# Patient Record
Sex: Female | Born: 1945 | Race: White | Hispanic: No | Marital: Married | Smoking: Never smoker
Health system: Southern US, Community
[De-identification: ages and names within clinical notes are randomized; demographics above are authoritative.]

## PROBLEM LIST (undated history)

## (undated) DIAGNOSIS — N390 Urinary tract infection, site not specified: Secondary | ICD-10-CM

## (undated) DIAGNOSIS — IMO0002 Reserved for concepts with insufficient information to code with codable children: Secondary | ICD-10-CM

## (undated) DIAGNOSIS — K219 Gastro-esophageal reflux disease without esophagitis: Secondary | ICD-10-CM

## (undated) HISTORY — PX: ANKLE SURGERY: SHX546

## (undated) HISTORY — PX: OTHER SURGICAL HISTORY: SHX169

## (undated) HISTORY — PX: TOE SURGERY: SHX1073

## (undated) HISTORY — PX: SHOULDER ARTHROSCOPY WITH ROTATOR CUFF REPAIR: SHX5685

## (undated) HISTORY — DX: Reserved for concepts with insufficient information to code with codable children: IMO0002

## (undated) HISTORY — PX: APPENDECTOMY: SHX54

## (undated) HISTORY — PX: ABDOMINAL HYSTERECTOMY: SHX81

---

## 2014-09-25 ENCOUNTER — Encounter: Payer: Self-pay | Admitting: Family Medicine

## 2014-09-25 ENCOUNTER — Ambulatory Visit (INDEPENDENT_AMBULATORY_CARE_PROVIDER_SITE_OTHER): Payer: 59 | Admitting: Family Medicine

## 2014-09-25 VITALS — BP 122/60 | HR 76 | Ht 63.5 in | Wt 142.2 lb

## 2014-09-25 DIAGNOSIS — H3530 Unspecified macular degeneration: Secondary | ICD-10-CM | POA: Insufficient documentation

## 2014-09-25 DIAGNOSIS — R739 Hyperglycemia, unspecified: Secondary | ICD-10-CM | POA: Diagnosis not present

## 2014-09-25 DIAGNOSIS — Z800 Family history of malignant neoplasm of digestive organs: Secondary | ICD-10-CM | POA: Diagnosis not present

## 2014-09-25 DIAGNOSIS — Z818 Family history of other mental and behavioral disorders: Secondary | ICD-10-CM

## 2014-09-25 DIAGNOSIS — R7303 Prediabetes: Secondary | ICD-10-CM

## 2014-09-25 DIAGNOSIS — H35369 Drusen (degenerative) of macula, unspecified eye: Secondary | ICD-10-CM

## 2014-09-25 DIAGNOSIS — R413 Other amnesia: Secondary | ICD-10-CM

## 2014-09-25 DIAGNOSIS — R7309 Other abnormal glucose: Secondary | ICD-10-CM

## 2014-09-25 DIAGNOSIS — Z85828 Personal history of other malignant neoplasm of skin: Secondary | ICD-10-CM

## 2014-09-25 DIAGNOSIS — G4700 Insomnia, unspecified: Secondary | ICD-10-CM | POA: Diagnosis not present

## 2014-09-25 DIAGNOSIS — E559 Vitamin D deficiency, unspecified: Secondary | ICD-10-CM | POA: Diagnosis not present

## 2014-09-25 DIAGNOSIS — Z825 Family history of asthma and other chronic lower respiratory diseases: Secondary | ICD-10-CM | POA: Diagnosis not present

## 2014-09-25 DIAGNOSIS — M79671 Pain in right foot: Secondary | ICD-10-CM | POA: Diagnosis not present

## 2014-09-25 DIAGNOSIS — Z23 Encounter for immunization: Secondary | ICD-10-CM | POA: Diagnosis not present

## 2014-09-25 MED ORDER — TRAZODONE HCL 50 MG PO TABS: 25.0000 mg | ORAL_TABLET | Freq: Every evening | ORAL | Status: DC | PRN

## 2014-09-26 DIAGNOSIS — Z23 Encounter for immunization: Secondary | ICD-10-CM | POA: Diagnosis not present

## 2014-09-26 DIAGNOSIS — E559 Vitamin D deficiency, unspecified: Secondary | ICD-10-CM | POA: Insufficient documentation

## 2014-09-26 DIAGNOSIS — R7303 Prediabetes: Secondary | ICD-10-CM | POA: Insufficient documentation

## 2014-09-26 LAB — COMPREHENSIVE METABOLIC PANEL
A/G RATIO: 1.8 (ref 1.1–2.5)
ALK PHOS: 84 IU/L (ref 39–117)
ALT: 30 IU/L (ref 0–32)
AST: 35 IU/L (ref 0–40)
Albumin: 5 g/dL — ABNORMAL HIGH (ref 3.5–4.8)
BILIRUBIN TOTAL: 0.4 mg/dL (ref 0.0–1.2)
BUN/Creatinine Ratio: 15 (ref 11–26)
BUN: 12 mg/dL (ref 8–27)
CHLORIDE: 99 mmol/L (ref 97–108)
CO2: 21 mmol/L (ref 18–29)
Calcium: 10.3 mg/dL (ref 8.7–10.3)
Creatinine, Ser: 0.79 mg/dL (ref 0.57–1.00)
GFR calc Af Amer: 87 mL/min/{1.73_m2} (ref >59)
GFR, EST NON AFRICAN AMERICAN: 76 mL/min/{1.73_m2} (ref >59)
GLOBULIN, TOTAL: 2.8 g/dL (ref 1.5–4.5)
Glucose: 95 mg/dL (ref 65–99)
POTASSIUM: 4.3 mmol/L (ref 3.5–5.2)
SODIUM: 141 mmol/L (ref 134–144)
Total Protein: 7.8 g/dL (ref 6.0–8.5)

## 2014-09-26 LAB — HEMOGLOBIN A1C
ESTIMATED AVERAGE GLUCOSE: 137 mg/dL
HEMOGLOBIN A1C: 6.4 % — AB (ref 4.8–5.6)

## 2014-09-26 LAB — CBC
HEMOGLOBIN: 14.7 g/dL (ref 11.1–15.9)
Hematocrit: 44.1 % (ref 34.0–46.6)
MCH: 30.8 pg (ref 26.6–33.0)
MCHC: 33.3 g/dL (ref 31.5–35.7)
MCV: 93 fL (ref 79–97)
PLATELETS: 267 10*3/uL (ref 150–379)
RBC: 4.77 x10E6/uL (ref 3.77–5.28)
RDW: 14.5 % (ref 12.3–15.4)
WBC: 7.1 10*3/uL (ref 3.4–10.8)

## 2014-09-26 LAB — VITAMIN D 25 HYDROXY (VIT D DEFICIENCY, FRACTURES): VIT D 25 HYDROXY: 26.4 ng/mL — AB (ref 30.0–100.0)

## 2014-09-26 LAB — VITAMIN B12: VITAMIN B 12: 684 pg/mL (ref 211–946)

## 2014-09-26 MED ORDER — VITAMIN D 50 MCG (2000 UT) PO CAPS: 1.0000 | ORAL_CAPSULE | Freq: Every day | ORAL | Status: DC

## 2014-09-27 ENCOUNTER — Encounter: Payer: Self-pay | Admitting: Family Medicine

## 2014-09-27 DIAGNOSIS — Z818 Family history of other mental and behavioral disorders: Secondary | ICD-10-CM | POA: Insufficient documentation

## 2014-09-27 DIAGNOSIS — Z800 Family history of malignant neoplasm of digestive organs: Secondary | ICD-10-CM | POA: Insufficient documentation

## 2014-09-27 DIAGNOSIS — Z825 Family history of asthma and other chronic lower respiratory diseases: Secondary | ICD-10-CM | POA: Insufficient documentation

## 2014-09-27 NOTE — Progress Notes (Signed)
Date:  09/25/2014   Name:  Melissa Trujillo   DOB:  1945-08-01   MRN:  [id number]  PCP:  [name], MD    Chief Complaint: Establish Care   History of Present Illness:  This is a 69 y.o. female with MMP to establish care. Has taken Xanax every 2-3 days to help sleep for 11 years, has not tried to discontinue. Hs Mohs surgery for BCC, needs derm f/u. Has preMD drusen, scheduled to see optho this month. 10# heavier than usual, not exercising as much due to R foot pain, has 1st MT implant, needs to see podiatry. Hyperglycemia on past labs. Some increased word finding and ST memory issues lately. Father died colon ca, mother with COPD, brother bipolar. Needs Tdap/zoster/Prevnar/flu (hx zoster 2 yrs ago).  Review of Systems:  Review of Systems  Constitutional: Negative for fever, chills and unexpected weight change.  HENT: Negative for ear pain, sore throat and trouble swallowing.   Eyes: Negative for pain.  Respiratory: Negative for shortness of breath.   Cardiovascular: Negative for chest pain and leg swelling.  Gastrointestinal: Negative for abdominal pain.  Endocrine: Negative for polyuria.  Genitourinary: Negative for difficulty urinating.  Skin: Negative for rash.  Neurological: Negative for dizziness, tremors and headaches.  Hematological: Negative for adenopathy.  Psychiatric/Behavioral: Negative for dysphoric mood.    Patient Active Problem List   Diagnosis Date Noted  . Prediabetes 09/26/2014  . Vitamin D insufficiency 09/26/2014  . Short-term memory loss 09/25/2014  . Insomnia 09/25/2014  . Hyperglycemia 09/25/2014  . Right foot pain 09/25/2014  . Hx of basal cell carcinoma 09/25/2014  . Macular degeneration 09/25/2014  . Drusen 09/25/2014    Prior to Admission medications   Medication Sig Start Date End Date Taking? Authorizing Provider  Multiple Vitamin (MULTIVITAMIN) tablet Take 1 tablet by mouth daily.   Yes Historical Provider, MD  Cholecalciferol (VITAMIN D)  2000 UNITS CAPS Take 1 capsule (2,000 Units total) by mouth daily. 09/26/14   [name], MD  traZODone (DESYREL) 50 MG tablet Take 0.5-1 tablets (25-50 mg total) by mouth at bedtime as needed for sleep. 09/25/14   [name], MD    No Known Allergies  Past Surgical History  Procedure Laterality Date  . Ankle surgery    . Shoulder arthroscopy with rotator cuff repair Bilateral   . Toe surgery Right     right prosthesis  . Abdominal hysterectomy    . Appendectomy    . Moes surgery      Left temporal area    Social History  Substance Use Topics  . Smoking status: Never Smoker   . Smokeless tobacco: Not on file  . Alcohol Use: 0.0 oz/week    0 Standard drinks or equivalent per week    No family history on file.  Medication list has been reviewed and updated.  Physical Examination: BP 122/60 mmHg  Pulse 76  Ht 5' 3.5" (1.613 m)  Wt 142 lb 3.2 oz (64.501 kg)  BMI 24.79 kg/m2  Physical Exam  Constitutional: She is oriented to person, place, and time. She appears well-developed and well-nourished.  HENT:  Head: Normocephalic and atraumatic.  Right Ear: External ear normal.  Left Ear: External ear normal.  Nose: Nose normal.  Mouth/Throat: Oropharynx is clear and moist.  Eyes: Conjunctivae and EOM are normal. Pupils are equal, round, and reactive to light.  Neck: Neck supple. No thyromegaly present.  Cardiovascular: Normal rate, regular rhythm and normal heart sounds.   Pulmonary/Chest: Effort  normal and breath sounds normal.  Abdominal: Soft. She exhibits no distension and no mass. There is no tenderness.  Musculoskeletal: She exhibits no edema.  Lymphadenopathy:    She has no cervical adenopathy.  Neurological: She is alert and oriented to person, place, and time. Coordination normal.  MMSE 25/30 (unable to perform any serial 7's, says always bad at math)  Skin: Skin is warm and dry.  Psychiatric: She has a normal mood and affect. Her behavior is normal.   Nursing note and vitals reviewed.   Assessment and Plan:  1. Right foot pain S/p 1st MT implant - Ambulatory referral to Podiatry  2. Short-term memory loss No objective cognitive deficit, check labs - Comprehensive metabolic panel - CBC - Vitamin D (25 hydroxy) - B12  3. Insomnia Advised d/c Xanax as may be contributing to ST memory issues, trial prn trazodone instead  4. Hyperglycemia - HgB A1c  5. Hx of basal cell carcinoma S/p Mohs surgery - Ambulatory referral to Dermatology  6. Drusen Has optho appt scheduled this month  7. Delayed imms Tdap and flu today, plan Prevnar/Zostavax next visit  Return in about 4 weeks (around 10/23/2014).  Satira Anis. [facility]  09/27/2014

## 2014-10-23 ENCOUNTER — Encounter: Payer: Self-pay | Admitting: Family Medicine

## 2014-10-23 ENCOUNTER — Ambulatory Visit (INDEPENDENT_AMBULATORY_CARE_PROVIDER_SITE_OTHER): Payer: 59 | Admitting: Family Medicine

## 2014-10-23 VITALS — BP 122/64 | HR 72 | Ht 63.5 in | Wt 151.0 lb

## 2014-10-23 DIAGNOSIS — G4700 Insomnia, unspecified: Secondary | ICD-10-CM | POA: Diagnosis not present

## 2014-10-23 DIAGNOSIS — R7303 Prediabetes: Secondary | ICD-10-CM

## 2014-10-23 DIAGNOSIS — Z85828 Personal history of other malignant neoplasm of skin: Secondary | ICD-10-CM | POA: Diagnosis not present

## 2014-10-23 DIAGNOSIS — E559 Vitamin D deficiency, unspecified: Secondary | ICD-10-CM | POA: Diagnosis not present

## 2014-10-23 DIAGNOSIS — Z283 Underimmunization status: Secondary | ICD-10-CM | POA: Diagnosis not present

## 2014-10-23 DIAGNOSIS — Z289 Immunization not carried out for unspecified reason: Secondary | ICD-10-CM

## 2014-10-23 DIAGNOSIS — M79671 Pain in right foot: Secondary | ICD-10-CM | POA: Diagnosis not present

## 2014-10-23 DIAGNOSIS — Z23 Encounter for immunization: Secondary | ICD-10-CM

## 2014-10-23 MED ORDER — ZOSTER VACCINE LIVE 19400 UNT/0.65ML ~~LOC~~ SOLR: 0.6500 mL | Freq: Once | SUBCUTANEOUS | Status: DC

## 2014-10-23 NOTE — Progress Notes (Signed)
Date:  10/23/2014   Name:  Melissa Trujillo   DOB:  [birth year]   MRN:  [id number]  PCP:  [name], MD    Chief Complaint: No chief complaint on file.   History of Present Illness:  This is a 69 y.o. female for f/u MMP and blood work. Saw podiatrist for foot pain, went well and pain improving. Feeling much better with d/c Xanax, did not tolerate trazodone due to dry mouth, taking Tylenol PM every second or third night, melatonin ineffective in past. Aware of prediabetes, trying to avoid sweets and lose weight, has started yoga classes. Scheduled to see derm 01/2015. Taking vitamin D supplements daily. Saw [facility], told no evidence drusen or MD, only slight cataracts.  Review of Systems:  Review of Systems  Constitutional: Negative for fever and fatigue.  Respiratory: Negative for shortness of breath.   Cardiovascular: Negative for chest pain and leg swelling.  Endocrine: Negative for polyuria.  Neurological: Negative for weakness and numbness.    Patient Active Problem List   Diagnosis Date Noted  . FH: colon cancer 09/27/2014  . FH: COPD (chronic obstructive pulmonary disease) 09/27/2014  . FH: bipolar disorder 09/27/2014  . Prediabetes 09/26/2014  . Vitamin D insufficiency 09/26/2014  . Short-term memory loss 09/25/2014  . Insomnia 09/25/2014  . Right foot pain 09/25/2014  . Hx of basal cell carcinoma 09/25/2014    Prior to Admission medications   Medication Sig Start Date End Date Taking? Authorizing Provider  Cholecalciferol (VITAMIN D) 2000 UNITS CAPS Take 1 capsule (2,000 Units total) by mouth daily. 09/26/14  Yes [name], MD  Multiple Vitamin (MULTIVITAMIN) tablet Take 1 tablet by mouth daily.   Yes Historical Provider, MD  traMADol (ULTRAM) 50 MG tablet Take by mouth.    Historical Provider, MD  zoster vaccine live, PF, (ZOSTAVAX) 79892 UNT/0.65ML injection Inject 19,400 Units into the skin once. 10/23/14   [name], MD    No Known Allergies  Past  Surgical History  Procedure Laterality Date  . Ankle surgery    . Shoulder arthroscopy with rotator cuff repair Bilateral   . Toe surgery Right     right prosthesis  . Abdominal hysterectomy    . Appendectomy    . Moes surgery      Left temporal area    Social History  Substance Use Topics  . Smoking status: Never Smoker   . Smokeless tobacco: Not on file  . Alcohol Use: 0.0 oz/week    0 Standard drinks or equivalent per week    No family history on file.  Medication list has been reviewed and updated.  Physical Examination: BP 122/64 mmHg  Pulse 72  Ht 5' 3.5" (1.613 m)  Wt 151 lb (68.493 kg)  BMI 26.33 kg/m2  Physical Exam  Constitutional: She appears well-developed and well-nourished.  Cardiovascular: Normal rate, regular rhythm and normal heart sounds.   Pulmonary/Chest: Effort normal and breath sounds normal.  Musculoskeletal: She exhibits no edema.  Neurological: She is alert.  Skin: Skin is warm and dry.  Psychiatric: She has a normal mood and affect. Her behavior is normal.    Assessment and Plan:  1. Insomnia Advised using Tylenol PM sparingly due to anticholinergic se's  2. Right foot pain Improved, followed by podiatry  3. Hx of basal cell carcinoma Derm appt 01/2015  4. Prediabetes Discussed NCS diet and weight loss, recheck a1c next visit  5. Vitamin D insufficiency Continue supplementation, recheck level next visit  6. Delayed  imms Prevnar today, rx for Zostavax sent  Return in about 3 months (around 01/23/2015).  Satira Anis. Alfonso Shackett, [facility]  10/23/2014

## 2015-01-10 ENCOUNTER — Encounter: Payer: Self-pay | Admitting: Family Medicine

## 2015-01-10 ENCOUNTER — Ambulatory Visit (INDEPENDENT_AMBULATORY_CARE_PROVIDER_SITE_OTHER): Payer: 59 | Admitting: Family Medicine

## 2015-01-10 VITALS — BP 142/74 | HR 88 | Temp 97.9°F | Ht 63.5 in | Wt 138.4 lb

## 2015-01-10 DIAGNOSIS — R030 Elevated blood-pressure reading, without diagnosis of hypertension: Secondary | ICD-10-CM | POA: Diagnosis not present

## 2015-01-10 DIAGNOSIS — E559 Vitamin D deficiency, unspecified: Secondary | ICD-10-CM

## 2015-01-10 DIAGNOSIS — J40 Bronchitis, not specified as acute or chronic: Secondary | ICD-10-CM | POA: Diagnosis not present

## 2015-01-10 DIAGNOSIS — R7303 Prediabetes: Secondary | ICD-10-CM

## 2015-01-10 DIAGNOSIS — IMO0001 Reserved for inherently not codable concepts without codable children: Secondary | ICD-10-CM

## 2015-01-10 MED ORDER — DOXYCYCLINE HYCLATE 100 MG PO CAPS: 100.0000 mg | ORAL_CAPSULE | Freq: Two times a day (BID) | ORAL | Status: DC

## 2015-01-10 NOTE — Progress Notes (Signed)
Date:  01/10/2015   Name:  Melissa Trujillo   DOB:  1945-11-25   MRN:  [id number]  PCP:  [name], MD    Chief Complaint: Sinusitis   History of Present Illness:  This is a 69 y.o. female with one month hx sore throat, sinus congestion, intermittent otalgia, myalgias, and cough occ productive green phlegm. Intermittent low grade fever only. Sxs getting worse. Taking vit D supplement, needs repeat a1c for prediabetes.  Review of Systems:  Review of Systems  HENT: Negative for trouble swallowing.   Eyes: Negative for pain.  Respiratory: Negative for shortness of breath.   Cardiovascular: Negative for chest pain and leg swelling.  Endocrine: Negative for polyuria.  Genitourinary: Negative for difficulty urinating.  Neurological: Negative for syncope and light-headedness.    Patient Active Problem List   Diagnosis Date Noted  . FH: colon cancer 09/27/2014  . FH: COPD (chronic obstructive pulmonary disease) 09/27/2014  . FH: bipolar disorder 09/27/2014  . Prediabetes 09/26/2014  . Vitamin D insufficiency 09/26/2014  . Insomnia 09/25/2014  . Right foot pain 09/25/2014  . Hx of basal cell carcinoma 09/25/2014    Prior to Admission medications   Medication Sig Start Date End Date Taking? Authorizing Provider  Cholecalciferol (VITAMIN D) 2000 UNITS CAPS Take 1 capsule (2,000 Units total) by mouth daily. 09/26/14  Yes [name], MD  Multiple Vitamin (MULTIVITAMIN) tablet Take 1 tablet by mouth daily.   Yes Historical Provider, MD  doxycycline (VIBRAMYCIN) 100 MG capsule Take 1 capsule (100 mg total) by mouth 2 (two) times daily. 01/10/15   [name], MD    No Known Allergies  Past Surgical History  Procedure Laterality Date  . Ankle surgery    . Shoulder arthroscopy with rotator cuff repair Bilateral   . Toe surgery Right     right prosthesis  . Abdominal hysterectomy    . Appendectomy    . Moes surgery      Left temporal area    Social History  Substance Use  Topics  . Smoking status: Never Smoker   . Smokeless tobacco: None  . Alcohol Use: 0.0 oz/week    0 Standard drinks or equivalent per week    No family history on file.  Medication list has been reviewed and updated.  Physical Examination: BP 142/74 mmHg  Pulse 88  Temp(Src) 97.9 F (36.6 C)  Ht 5' 3.5" (1.613 m)  Wt 138 lb 6.4 oz (62.778 kg)  BMI 24.13 kg/m2  SpO2 100%  Physical Exam  Constitutional: She appears well-developed and well-nourished.  HENT:  Right Ear: External ear normal.  Left Ear: External ear normal.  Nose: Nose normal.  Mouth/Throat: Oropharynx is clear and moist.  TM's clear  Pulmonary/Chest: Effort normal and breath sounds normal.  Musculoskeletal: She exhibits no edema.  Lymphadenopathy:    She has no cervical adenopathy.  Neurological: She is alert.  Skin: Skin is warm and dry.  Psychiatric: She has a normal mood and affect. Her behavior is normal.  Nursing note and vitals reviewed.   Assessment and Plan:  1. Bronchitis Persistent x 1 month, doxy x 7d, Sudafed 12h for congestion, call if sxs worsen/persist  2. Prediabetes Recheck a1c - HgB A1c  3. Vitamin D insufficiency On supplement - Vitamin D (25 hydroxy)  4. Elevated blood pressure Likely due to acute illness  Return in about 6 months (around 07/11/2015).  Satira Anis. [facility]  01/10/2015

## 2015-01-11 LAB — VITAMIN D 25 HYDROXY (VIT D DEFICIENCY, FRACTURES): Vit D, 25-Hydroxy: 38.9 ng/mL (ref 30.0–100.0)

## 2015-01-11 LAB — HEMOGLOBIN A1C
ESTIMATED AVERAGE GLUCOSE: 140 mg/dL
HEMOGLOBIN A1C: 6.5 % — AB (ref 4.8–5.6)

## 2015-01-23 ENCOUNTER — Ambulatory Visit: Payer: 59 | Admitting: Family Medicine

## 2015-03-26 ENCOUNTER — Ambulatory Visit (INDEPENDENT_AMBULATORY_CARE_PROVIDER_SITE_OTHER): Payer: BLUE CROSS/BLUE SHIELD | Admitting: Family Medicine

## 2015-03-26 ENCOUNTER — Encounter: Payer: Self-pay | Admitting: Family Medicine

## 2015-03-26 VITALS — BP 142/84 | HR 84 | Ht 63.5 in | Wt 142.6 lb

## 2015-03-26 DIAGNOSIS — R350 Frequency of micturition: Secondary | ICD-10-CM | POA: Diagnosis not present

## 2015-03-26 DIAGNOSIS — N3090 Cystitis, unspecified without hematuria: Secondary | ICD-10-CM | POA: Diagnosis not present

## 2015-03-26 MED ORDER — SULFAMETHOXAZOLE-TRIMETHOPRIM 800-160 MG PO TABS: 1.0000 | ORAL_TABLET | Freq: Two times a day (BID) | ORAL | Status: DC

## 2015-03-26 NOTE — Progress Notes (Signed)
Name: Melissa Trujillo   MRN: [id number]    DOB: 04/10/1945   Date:03/27/2015       Progress Note  Subjective  Chief Complaint  Chief Complaint  Patient presents with  . Urinary Tract Infection    Patient states that symptoms started 1 week ago. She states that she has some back pain, states that AZO does not help.     Urinary Tract Infection  This is a new problem. The current episode started in the past 7 days. The problem occurs every urination. The problem has been gradually worsening. The quality of the pain is described as burning. The pain is moderate. Maximum temperature: low grade. There is a history of pyelonephritis. Associated symptoms include chills, flank pain, frequency and urgency. Pertinent negatives include no hematuria or nausea. She has tried acetaminophen (azo) for the symptoms. The treatment provided no relief. Her past medical history is significant for recurrent UTIs.    No problem-specific assessment & plan notes found for this encounter.   Past Medical History  Diagnosis Date  . Squamous cell carcinoma [facility])     Past Surgical History  Procedure Laterality Date  . Ankle surgery    . Shoulder arthroscopy with rotator cuff repair Bilateral   . Toe surgery Right     right prosthesis  . Abdominal hysterectomy    . Appendectomy    . Moes surgery      Left temporal area    History reviewed. No pertinent family history.  Social History   Social History  . Marital Status: Married    Spouse Name: N/A  . Number of Children: N/A  . Years of Education: N/A   Occupational History  . Not on file.   Social History Main Topics  . Smoking status: Never Smoker   . Smokeless tobacco: Not on file  . Alcohol Use: 0.0 oz/week    0 Standard drinks or equivalent per week  . Drug Use: No  . Sexual Activity: Not on file   Other Topics Concern  . Not on file   Social History Narrative    No Known Allergies   Review of Systems  Constitutional: Positive for  chills. Negative for fever, weight loss and malaise/fatigue.  HENT: Negative for ear discharge, ear pain and sore throat.   Eyes: Negative for blurred vision.  Respiratory: Negative for cough, sputum production, shortness of breath and wheezing.   Cardiovascular: Negative for chest pain, palpitations and leg swelling.  Gastrointestinal: Negative for heartburn, nausea, abdominal pain, diarrhea, constipation, blood in stool and melena.  Genitourinary: Positive for urgency, frequency and flank pain. Negative for dysuria and hematuria.  Musculoskeletal: Negative for myalgias, back pain, joint pain and neck pain.  Skin: Negative for rash.  Neurological: Negative for dizziness, tingling, sensory change, focal weakness and headaches.  Endo/Heme/Allergies: Negative for environmental allergies and polydipsia. Does not bruise/bleed easily.  Psychiatric/Behavioral: Negative for depression and suicidal ideas. The patient is not nervous/anxious and does not have insomnia.      Objective  Filed Vitals:   03/26/15 1501  BP: 142/84  Pulse: 84  Height: 5' 3.5" (1.613 m)  Weight: 142 lb 9.6 oz (64.683 kg)    Physical Exam  Constitutional: She is well-developed, well-nourished, and in no distress. No distress.  HENT:  Head: Normocephalic and atraumatic.  Right Ear: External ear normal.  Left Ear: External ear normal.  Nose: Nose normal.  Mouth/Throat: Oropharynx is clear and moist.  Eyes: Conjunctivae and EOM are normal. Pupils are  equal, round, and reactive to light. Right eye exhibits no discharge. Left eye exhibits no discharge.  Neck: Normal range of motion. Neck supple. No JVD present. No thyromegaly present.  Cardiovascular: Normal rate, regular rhythm, normal heart sounds and intact distal pulses.  Exam reveals no gallop and no friction rub.   No murmur heard. Pulmonary/Chest: Effort normal and breath sounds normal.  Abdominal: Soft. Bowel sounds are normal. She exhibits no mass. There is  no tenderness. There is no guarding.  Musculoskeletal: Normal range of motion. She exhibits no edema.  Lymphadenopathy:    She has no cervical adenopathy.  Neurological: She is alert. She has normal reflexes.  Skin: Skin is warm and dry. She is not diaphoretic.  Psychiatric: Mood and affect normal.  Nursing note and vitals reviewed.     Assessment & Plan  Problem List Items Addressed This Visit    None    Visit Diagnoses    Urinary frequency    -  Primary    Cystitis        Relevant Medications    sulfamethoxazole-trimethoprim (BACTRIM DS,SEPTRA DS) 800-160 MG tablet         Dr. Trevor [facility]  03/27/2015

## 2015-03-27 MED ORDER — CIPROFLOXACIN HCL 250 MG PO TABS
250.0000 mg | ORAL_TABLET | Freq: Two times a day (BID) | ORAL | Status: DC
Start: 2015-03-27 — End: 2015-07-21

## 2015-03-27 NOTE — Addendum Note (Signed)
Addended by: Fredderick Severance on: 03/27/2015 08:31 AM   Modules accepted: Orders, Medications

## 2015-03-29 ENCOUNTER — Ambulatory Visit
Admission: EM | Admit: 2015-03-29 | Discharge: 2015-03-29 | Disposition: A | Discharge status: Home / Self Care | Payer: BLUE CROSS/BLUE SHIELD | Attending: Family Medicine | Admitting: Family Medicine

## 2015-03-29 DIAGNOSIS — R350 Frequency of micturition: Secondary | ICD-10-CM | POA: Insufficient documentation

## 2015-03-29 HISTORY — DX: Urinary tract infection, site not specified: N39.0

## 2015-03-29 LAB — URINALYSIS COMPLETE WITH MICROSCOPIC (ARMC ONLY)
BACTERIA UA: NONE SEEN
Bilirubin Urine: NEGATIVE
GLUCOSE, UA: NEGATIVE mg/dL
HGB URINE DIPSTICK: NEGATIVE
Ketones, ur: NEGATIVE mg/dL
Leukocytes, UA: NEGATIVE
NITRITE: NEGATIVE
PROTEIN: NEGATIVE mg/dL
RBC / HPF: NONE SEEN RBC/hpf (ref 0–5)
SPECIFIC GRAVITY, URINE: 1.005 (ref 1.005–1.030)
pH: 6.5 (ref 5.0–8.0)

## 2015-03-29 NOTE — ED Provider Notes (Signed)
CSN: [id number]     Arrival date & time 03/29/15  1053 History   First MD Initiated Contact with Patient 03/29/15 1150     Chief Complaint  Patient presents with  . Urinary Frequency    One week of burning with urination, frequency and right low back pain. Saw Dr. Ronnald Ramp on Wednesday and placed on Cipro. Pain 2/10.     (Consider location/radiation/quality/duration/timing/severity/associated sxs/prior Treatment) HPI Comments: 70 yo female with a 5 days h/o urinary frequency and burning, seen by pcp 3 days ago and started on ciprofloxacin. Patient states continues with frequent urination. Denies any fevers, chills, abdominal pain. Does describe right lower back pain.   Patient is a 70 y.o. female presenting with frequency. The history is provided by the patient.  Urinary Frequency    Past Medical History  Diagnosis Date  . Squamous cell carcinoma ([place])   . Urinary tract infection    Past Surgical History  Procedure Laterality Date  . Ankle surgery    . Shoulder arthroscopy with rotator cuff repair Bilateral   . Toe surgery Right     right prosthesis  . Abdominal hysterectomy    . Appendectomy    . Moes surgery      Left temporal area  . Carcnioma     History reviewed. No pertinent family history. Social History  Substance Use Topics  . Smoking status: Never Smoker   . Smokeless tobacco: None  . Alcohol Use: 0.0 oz/week    0 Standard drinks or equivalent per week   OB History    No data available     Review of Systems  Genitourinary: Positive for frequency.    Allergies  Sulfa antibiotics  Home Medications   Prior to Admission medications   Medication Sig Start Date End Date Taking? Authorizing Provider  Cholecalciferol (VITAMIN D) 2000 UNITS CAPS Take 1 capsule (2,000 Units total) by mouth daily. 09/26/14  Yes [name], MD  ciprofloxacin (CIPRO) 250 MG tablet Take 1 tablet (250 mg total) by mouth 2 (two) times daily. 03/27/15  Yes Juline Patch, MD   lactobacillus acidophilus (BACID) TABS tablet Take 2 tablets by mouth 3 (three) times daily.   Yes Historical Provider, MD  Multiple Vitamin (MULTIVITAMIN) tablet Take 1 tablet by mouth daily.   Yes Historical Provider, MD  Omega-3 Fatty Acids (FISH OIL) 1000 MG CAPS Take by mouth.   Yes Historical Provider, MD   Meds Ordered and Administered this Visit  Medications - No data to display  BP 144/69 mmHg  Pulse 82  Temp(Src) 97.6 F (36.4 C) (Oral)  Resp 18  Ht 5\' 3"  (1.6 m)  Wt 133 lb (60.328 kg)  BMI 23.57 kg/m2  SpO2 99% No data found.   Physical Exam  Constitutional: She appears well-developed and well-nourished. No distress.  Abdominal: Soft. Bowel sounds are normal. She exhibits no distension and no mass. There is no tenderness. There is no rebound and no guarding.  Musculoskeletal:       Lumbar back: She exhibits tenderness (over the right lumbar paraspinous muscles). She exhibits normal range of motion, no bony tenderness, no swelling, no edema, no deformity, no laceration, no pain, no spasm and normal pulse.  Skin: She is not diaphoretic.  Nursing note and vitals reviewed.   ED Course  Procedures (including critical care time)  Labs Review Labs Reviewed  URINALYSIS COMPLETEWITH MICROSCOPIC ([place] ONLY) - Abnormal; Notable for the following:    Color, Urine STRAW (*)  Squamous Epithelial / LPF 0-5 (*)    All other components within normal limits  URINE CULTURE    Imaging Review No results found.   Visual Acuity Review  Right Eye Distance:   Left Eye Distance:   Bilateral Distance:    Right Eye Near:   Left Eye Near:    Bilateral Near:         MDM   1. Urinary frequency   (unknown etiology)  Discharge Medication List as of 03/29/2015 12:04 PM     1. Lab results (negative UA) and diagnosis/possible etiology reviewed with patient 2.recommend continue and finish current antibiotic (cipro) prescribed by PCP 3. Follow-up with pcp    Norval Gable, MD 03/29/15 1310

## 2015-03-31 LAB — URINE CULTURE
CULTURE: NO GROWTH
SPECIAL REQUESTS: NORMAL

## 2015-04-15 ENCOUNTER — Ambulatory Visit: Payer: 59 | Admitting: Family Medicine

## 2015-05-30 ENCOUNTER — Encounter: Payer: Self-pay | Admitting: Emergency Medicine

## 2015-05-30 ENCOUNTER — Ambulatory Visit
Admission: EM | Admit: 2015-05-30 | Discharge: 2015-05-30 | Disposition: A | Discharge status: Home / Self Care | Payer: Medicare Other | Attending: Family Medicine | Admitting: Family Medicine

## 2015-05-30 DIAGNOSIS — W57XXXA Bitten or stung by nonvenomous insect and other nonvenomous arthropods, initial encounter: Secondary | ICD-10-CM | POA: Diagnosis not present

## 2015-05-30 DIAGNOSIS — T148 Other injury of unspecified body region: Secondary | ICD-10-CM | POA: Diagnosis not present

## 2015-05-30 DIAGNOSIS — L03113 Cellulitis of right upper limb: Secondary | ICD-10-CM | POA: Diagnosis not present

## 2015-05-30 DIAGNOSIS — L03116 Cellulitis of left lower limb: Secondary | ICD-10-CM

## 2015-05-30 MED ORDER — CEPHALEXIN 500 MG PO CAPS: 500.0000 mg | ORAL_CAPSULE | Freq: Three times a day (TID) | ORAL | Status: DC

## 2015-05-30 NOTE — ED Notes (Signed)
Patient c/o 3 itchy blisters on both her inner thigh that started 4 days ago. Patient denies fevers.

## 2015-07-21 ENCOUNTER — Observation Stay
Admission: EM | Admit: 2015-07-21 | Discharge: 2015-07-22 | Disposition: A | Discharge status: Home / Self Care | Payer: BLUE CROSS/BLUE SHIELD | Attending: Internal Medicine | Admitting: Internal Medicine

## 2015-07-21 ENCOUNTER — Encounter: Payer: Self-pay | Admitting: Emergency Medicine

## 2015-07-21 ENCOUNTER — Emergency Department: Payer: BLUE CROSS/BLUE SHIELD

## 2015-07-21 ENCOUNTER — Ambulatory Visit (INDEPENDENT_AMBULATORY_CARE_PROVIDER_SITE_OTHER)
Admission: EM | Admit: 2015-07-21 | Discharge: 2015-07-21 | Disposition: A | Discharge status: Other Acute Inpatient Hospital | Payer: BLUE CROSS/BLUE SHIELD | Source: Home / Self Care | Attending: Family Medicine | Admitting: Family Medicine

## 2015-07-21 DIAGNOSIS — I700 Atherosclerosis of aorta: Secondary | ICD-10-CM | POA: Insufficient documentation

## 2015-07-21 DIAGNOSIS — R079 Chest pain, unspecified: Principal | ICD-10-CM | POA: Diagnosis present

## 2015-07-21 DIAGNOSIS — K219 Gastro-esophageal reflux disease without esophagitis: Secondary | ICD-10-CM | POA: Diagnosis not present

## 2015-07-21 DIAGNOSIS — Z87440 Personal history of urinary (tract) infections: Secondary | ICD-10-CM | POA: Diagnosis not present

## 2015-07-21 DIAGNOSIS — Z85828 Personal history of other malignant neoplasm of skin: Secondary | ICD-10-CM | POA: Insufficient documentation

## 2015-07-21 DIAGNOSIS — Z9889 Other specified postprocedural states: Secondary | ICD-10-CM | POA: Diagnosis not present

## 2015-07-21 DIAGNOSIS — Z90710 Acquired absence of both cervix and uterus: Secondary | ICD-10-CM | POA: Insufficient documentation

## 2015-07-21 LAB — CBC
HEMATOCRIT: 42.3 % (ref 35.0–47.0)
Hemoglobin: 14.5 g/dL (ref 12.0–16.0)
MCH: 31.2 pg (ref 26.0–34.0)
MCHC: 34.3 g/dL (ref 32.0–36.0)
MCV: 90.9 fL (ref 80.0–100.0)
PLATELETS: 267 10*3/uL (ref 150–440)
RBC: 4.65 MIL/uL (ref 3.80–5.20)
RDW: 14.5 % (ref 11.5–14.5)
WBC: 5.7 10*3/uL (ref 3.6–11.0)

## 2015-07-21 LAB — BASIC METABOLIC PANEL
Anion gap: 9 (ref 5–15)
BUN: 13 mg/dL (ref 6–20)
CHLORIDE: 106 mmol/L (ref 101–111)
CO2: 26 mmol/L (ref 22–32)
Calcium: 10.4 mg/dL — ABNORMAL HIGH (ref 8.9–10.3)
Creatinine, Ser: 0.72 mg/dL (ref 0.44–1.00)
Glucose, Bld: 131 mg/dL — ABNORMAL HIGH (ref 65–99)
POTASSIUM: 3.8 mmol/L (ref 3.5–5.1)
SODIUM: 141 mmol/L (ref 135–145)

## 2015-07-21 LAB — TROPONIN I
Troponin I: <0.03 ng/mL (ref <0.03)
Troponin I: <0.03 ng/mL (ref <0.03)

## 2015-07-21 MED ORDER — OXYCODONE HCL 5 MG PO TABS
5.0000 mg | ORAL_TABLET | ORAL | Status: DC | PRN
  Administered 2015-07-22: 5 mg via ORAL
  Filled 2015-07-21: qty 1

## 2015-07-21 MED ORDER — NITROGLYCERIN 0.4 MG SL SUBL: 0.4000 mg | SUBLINGUAL_TABLET | SUBLINGUAL | Status: DC | PRN

## 2015-07-21 MED ORDER — ONDANSETRON HCL 4 MG/2ML IJ SOLN: 4.0000 mg | Freq: Four times a day (QID) | INTRAMUSCULAR | Status: DC | PRN

## 2015-07-21 MED ORDER — ASPIRIN EC 81 MG PO TBEC
81.0000 mg | DELAYED_RELEASE_TABLET | Freq: Every day | ORAL | Status: DC
  Administered 2015-07-22: 81 mg via ORAL
  Filled 2015-07-21: qty 1

## 2015-07-21 MED ORDER — SODIUM CHLORIDE 0.9% FLUSH
3.0000 mL | Freq: Two times a day (BID) | INTRAVENOUS | Status: DC
  Administered 2015-07-21 – 2015-07-22 (×2): 3 mL via INTRAVENOUS

## 2015-07-21 MED ORDER — ONDANSETRON HCL 4 MG PO TABS: 4.0000 mg | ORAL_TABLET | Freq: Four times a day (QID) | ORAL | Status: DC | PRN

## 2015-07-21 MED ORDER — ASPIRIN 81 MG PO CHEW
324.0000 mg | CHEWABLE_TABLET | Freq: Once | ORAL | Status: AC
  Administered 2015-07-21: 324 mg via ORAL
  Filled 2015-07-21: qty 4

## 2015-07-21 MED ORDER — ACETAMINOPHEN 650 MG RE SUPP: 650.0000 mg | Freq: Four times a day (QID) | RECTAL | Status: DC | PRN

## 2015-07-21 MED ORDER — ENOXAPARIN SODIUM 40 MG/0.4ML ~~LOC~~ SOLN: 40.0000 mg | SUBCUTANEOUS | Status: DC

## 2015-07-21 MED ORDER — PANTOPRAZOLE SODIUM 40 MG PO TBEC
40.0000 mg | DELAYED_RELEASE_TABLET | Freq: Every day | ORAL | Status: DC
  Administered 2015-07-21 – 2015-07-22 (×2): 40 mg via ORAL
  Filled 2015-07-21 (×2): qty 1

## 2015-07-21 MED ORDER — CALCIUM CARBONATE ANTACID 500 MG PO CHEW
1.0000 | CHEWABLE_TABLET | ORAL | Status: DC | PRN
  Administered 2015-07-21 – 2015-07-22 (×2): 200 mg via ORAL
  Filled 2015-07-21 (×2): qty 1

## 2015-07-21 MED ORDER — MORPHINE SULFATE (PF) 2 MG/ML IV SOLN: 2.0000 mg | INTRAVENOUS | Status: DC | PRN

## 2015-07-21 MED ORDER — ACETAMINOPHEN 325 MG PO TABS: 650.0000 mg | ORAL_TABLET | Freq: Four times a day (QID) | ORAL | Status: DC | PRN

## 2015-07-21 MED ORDER — ASPIRIN 81 MG PO CHEW
324.0000 mg | CHEWABLE_TABLET | Freq: Once | ORAL | Status: AC
  Administered 2015-07-21: 324 mg via ORAL

## 2015-07-21 NOTE — ED Provider Notes (Signed)
[facility] Emergency Department Provider Note   ____________________________________________    I have reviewed the triage vital signs and the nursing notes.   HISTORY  Chief Complaint Chest Pain     HPI Melissa Trujillo is a 70 y.o. female who presents for chest pain. Patient reports she was lying in bed at 7 AM this morning and developed significant chest pressure with radiation to her right neck. She reports she broke out in a sweat and the pain worsened and she felt somewhat short of breath. She went to urgent care and was referred to the emergency department. She reports her pain has improved significantly and she feels well currently. No calf pain or swelling. No nausea or vomiting. She reports she has had this before but attributed it to a GI issue   Past Medical History  Diagnosis Date  . 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 Urinary tract infection     Patient Active Problem List   Diagnosis Date Noted  . FH: colon cancer 09/27/2014  . FH: COPD (chronic obstructive pulmonary disease) 09/27/2014  . FH: bipolar disorder 09/27/2014  . Prediabetes 09/26/2014  . Vitamin D insufficiency 09/26/2014  . Insomnia 09/25/2014  . Right foot pain 09/25/2014  . Hx of basal cell carcinoma 09/25/2014    Past Surgical History  Procedure Laterality Date  . Ankle surgery    . Shoulder arthroscopy with rotator cuff repair Bilateral   . Toe surgery Right     right prosthesis  . Abdominal hysterectomy    . Appendectomy    . Moes surgery      Left temporal area  . Schriever      Current Outpatient Rx  Name  Route  Sig  Dispense  Refill  . calcium carbonate (TUMS - DOSED IN MG ELEMENTAL CALCIUM) 500 MG chewable tablet   Oral   Chew 1 tablet by mouth as needed for indigestion or heartburn.         . lactobacillus acidophilus (BACID) TABS tablet   Oral   Take 1 tablet by mouth daily.          . Multiple Vitamin (MULTIVITAMIN) tablet   Oral  Take 1 tablet by mouth daily.         . Omega-3 Fatty Acids (FISH OIL) 1000 MG CAPS   Oral   Take 1,000 mg by mouth daily.            Allergies Sulfa antibiotics  Family History  Problem Relation Age of Onset  . Diabetes Daughter     Social History Social History  Substance Use Topics  . Smoking status: Never Smoker   . Smokeless tobacco: None  . Alcohol Use: 0.0 oz/week    0 Standard drinks or equivalent per week    Review of Systems  Constitutional: No fever/chills Eyes: No visual changes.  ENT: No sore throat. Cardiovascular: As above Respiratory: As above Gastrointestinal: No abdominal pain.  No nausea, no vomiting.   Genitourinary: Negative for dysuria. Musculoskeletal: Negative for back pain. Skin: Negative for rash. Neurological: Negative for headaches  10-point ROS otherwise negative.  ____________________________________________   PHYSICAL EXAM:  VITAL SIGNS: ED Triage Vitals  Enc Vitals Group     BP 07/21/15 1022 133/92 mmHg     Pulse Rate 07/21/15 1022 67     Resp 07/21/15 1022 20     Temp 07/21/15 1022 98.2 F (36.8 C)     Temp Source 07/21/15 1022 Oral  SpO2 07/21/15 1022 98 %     Weight 07/21/15 1022 135 lb (61.236 kg)     Height 07/21/15 1022 5\' 3"  (1.6 m)     Head Cir --      Peak Flow --      Pain Score 07/21/15 1023 5     Pain Loc --      Pain Edu? --      Excl. in [place]? --     Constitutional: Alert and oriented. No acute distress. Pleasant and interactive Eyes: Conjunctivae are normal.  Head: Normocephalic Nose: No congestion/rhinnorhea. Mouth/Throat: Mucous membranes are moist.   Neck: No stridor. Painless ROM Cardiovascular: Normal rate, regular rhythm. Grossly normal heart sounds.  Good peripheral circulation. Respiratory: Normal respiratory effort.  No retractions. Lungs CTAB. Gastrointestinal: Soft and nontender. No distention.  No CVA tenderness. Genitourinary: deferred Musculoskeletal: No lower extremity  tenderness nor edema.  Warm and well perfused Neurologic:  Normal speech and language. No gross focal neurologic deficits are appreciated.  Skin:  Skin is warm, dry and intact. No rash noted. Psychiatric: Mood and affect are normal. Speech and behavior are normal.  ____________________________________________   LABS (all labs ordered are listed, but only abnormal results are displayed)  Labs Reviewed  CBC  BASIC METABOLIC PANEL  TROPONIN I   ____________________________________________  EKG  ED ECG REPORT I, Lavonia Drafts, the attending physician, personally viewed and interpreted this ECG.  Date: 07/21/2015 EKG Time: 10:21 AM Rate: 71 Rhythm: normal sinus rhythm QRS Axis: Left axis deviation Intervals: normal ST/T Wave abnormalities: normal Conduction Disturbances: none Narrative Interpretation: unremarkable  ____________________________________________  RADIOLOGY  Chest x-ray no acute distress ____________________________________________   PROCEDURES  Procedure(s) performed: No    Critical Care performed: No ____________________________________________   INITIAL IMPRESSION / ASSESSMENT AND PLAN / ED COURSE  Pertinent labs & imaging results that were available during my care of the patient were reviewed by me and considered in my medical decision making (see chart for details).  Patient presents with chest pain which has improved, however her initial symptoms are quite concerning. Troponin is normal, chest x-ray unremarkable, EKG nonspecific. However I feel admission to the hospital with cardiology evaluation is appropriate. 325 aspirin given in the ED ____________________________________________   FINAL CLINICAL IMPRESSION(S) / ED DIAGNOSES  Final diagnoses:  Chest pain, unspecified chest pain type      NEW MEDICATIONS STARTED DURING THIS VISIT:  New Prescriptions   No medications on file     Note:  This document was prepared using Dragon  voice recognition software and may include unintentional dictation errors.    Lavonia Drafts, MD 07/21/15 1125

## 2015-07-21 NOTE — H&P (Signed)
[facility] NAME: Melissa Trujillo    MR#:  [id number]  DATE OF BIRTH:  11-05-1945   DATE OF ADMISSION:  07/21/2015  PRIMARY CARE PHYSICIAN: [name], MD   REQUESTING/REFERRING PHYSICIAN: Corky Downs  CHIEF COMPLAINT:   Chief Complaint  Patient presents with  . Chest Pain    HISTORY OF PRESENT ILLNESS:  Melissa Trujillo  is a 70 y.o. female with a known history of GERD well controlled presenting with chest pain. She describes acute onset chest pain which awoke her around 7 in the morning retrosternal in location pressure in quality radiation to the neck intensity 8/10. She states she tried to take some Tums and belch without any relief no worsening or relieving factors. Currently chest pain-free. She had associated shortness of breath and diaphoresis during the episode.  PAST MEDICAL HISTORY:   Past Medical History  Diagnosis Date  . Squamous cell carcinoma ([place])   . Urinary tract infection     PAST SURGICAL HISTORY:   Past Surgical History  Procedure Laterality Date  . Ankle surgery    . Shoulder arthroscopy with rotator cuff repair Bilateral   . Toe surgery Right     right prosthesis  . Abdominal hysterectomy    . Appendectomy    . Moes surgery      Left temporal area  . Carcnioma      SOCIAL HISTORY:   Social History  Substance Use Topics  . Smoking status: Never Smoker   . Smokeless tobacco: Not on file  . Alcohol Use: 0.0 oz/week    0 Standard drinks or equivalent per week    FAMILY HISTORY:   Family History  Problem Relation Age of Onset  . Diabetes Daughter     DRUG ALLERGIES:   Allergies  Allergen Reactions  . Sulfa Antibiotics Hives    REVIEW OF SYSTEMS:  REVIEW OF SYSTEMS:  CONSTITUTIONAL: Denies fevers, chills, fatigue, weakness.  EYES: Denies blurred vision, double vision, or eye pain.  EARS, NOSE, THROAT: Denies tinnitus, ear pain, hearing loss.  RESPIRATORY: denies cough, Positive  shortness of breath, denies wheezing  CARDIOVASCULAR: Positive chest pain, denies palpitations, edema.  GASTROINTESTINAL: Denies nausea, vomiting, diarrhea, abdominal pain.  GENITOURINARY: Denies dysuria, hematuria.  ENDOCRINE: Denies nocturia or thyroid problems. HEMATOLOGIC AND LYMPHATIC: Denies easy bruising or bleeding.  SKIN: Denies rash or lesions.  MUSCULOSKELETAL: Denies pain in neck, back, shoulder, knees, hips, or further arthritic symptoms.  NEUROLOGIC: Denies paralysis, paresthesias.  PSYCHIATRIC: Denies anxiety or depressive symptoms. Otherwise full review of systems performed by me is negative.   MEDICATIONS AT HOME:   Prior to Admission medications   Medication Sig Start Date End Date Taking? Authorizing Provider  calcium carbonate (TUMS - DOSED IN MG ELEMENTAL CALCIUM) 500 MG chewable tablet Chew 1 tablet by mouth as needed for indigestion or heartburn.   Yes Historical Provider, MD  lactobacillus acidophilus (BACID) TABS tablet Take 1 tablet by mouth daily.    Yes Historical Provider, MD  Multiple Vitamin (MULTIVITAMIN) tablet Take 1 tablet by mouth daily.   Yes Historical Provider, MD  Omega-3 Fatty Acids (FISH OIL) 1000 MG CAPS Take 1,000 mg by mouth daily.    Yes Historical Provider, MD      VITAL SIGNS:  Blood pressure 133/92, pulse 67, temperature 98.2 F (36.8 C), temperature source Oral, resp. rate 20, height 5\' 3"  (1.6 m), weight 135 lb (61.236 kg), SpO2 98 %.  PHYSICAL EXAMINATION:  VITAL  SIGNS: Filed Vitals:   07/21/15 1022  BP: 133/92  Pulse: 67  Temp: 98.2 F (36.8 C)  Resp: 39   GENERAL:70 y.o.female currently in no acute distress.  HEAD: Normocephalic, atraumatic.  EYES: Pupils equal, round, reactive to light. Extraocular muscles intact. No scleral icterus.  MOUTH: Moist mucosal membrane. Dentition intact. No abscess noted.  EAR, NOSE, THROAT: Clear without exudates. No external lesions.  NECK: Supple. No thyromegaly. No nodules. No JVD.    PULMONARY: Clear to ascultation, without wheeze rails or rhonci. No use of accessory muscles, Good respiratory effort. good air entry bilaterally CHEST: Nontender to palpation.  CARDIOVASCULAR: S1 and S2. Regular rate and rhythm. No murmurs, rubs, or gallops. No edema. Pedal pulses 2+ bilaterally.  GASTROINTESTINAL: Soft, nontender, nondistended. No masses. Positive bowel sounds. No hepatosplenomegaly.  MUSCULOSKELETAL: No swelling, clubbing, or edema. Range of motion full in all extremities.  NEUROLOGIC: Cranial nerves II through XII are intact. No gross focal neurological deficits. Sensation intact. Reflexes intact.  SKIN: No ulceration, lesions, rashes, or cyanosis. Skin warm and dry. Turgor intact.  PSYCHIATRIC: Mood, affect within normal limits. The patient is awake, alert and oriented x 3. Insight, judgment intact.    LABORATORY PANEL:   CBC  Recent Labs Lab 07/21/15 1028  WBC 5.7  HGB 14.5  HCT 42.3  PLT 267   ------------------------------------------------------------------------------------------------------------------  Chemistries   Recent Labs Lab 07/21/15 1028  NA 141  K 3.8  CL 106  CO2 26  GLUCOSE 131*  BUN 13  CREATININE 0.72  CALCIUM 10.4*   ------------------------------------------------------------------------------------------------------------------  Cardiac Enzymes  Recent Labs Lab 07/21/15 1028  TROPONINI <0.03   ------------------------------------------------------------------------------------------------------------------  RADIOLOGY:  Dg Chest Portable 1 View  07/21/2015  CLINICAL DATA:  Chest pain for 1 day EXAM: PORTABLE CHEST 1 VIEW COMPARISON:  None. FINDINGS: There is no edema or consolidation. The heart size and pulmonary vascularity are normal. No adenopathy. There is calcification in the aortic arch region. There is postoperative change in the right shoulder. IMPRESSION: Aortic atherosclerosis.  No edema or consolidation.  Electronically Signed   By: Lowella Grip III M.D.   On: 07/21/2015 11:16    EKG:   Orders placed or performed during the hospital encounter of 07/21/15  . EKG 12-Lead  . EKG 12-Lead  . ED EKG within 10 minutes  . ED EKG within 10 minutes    IMPRESSION AND PLAN:   70 year old Caucasian female history of GERD well-controlled presented with chest pain  1. Chest pain, central: Initiate aspirin and statin therapy, admitted to telemetry, trend cardiac enzymes 3,  if continued elevation will initiate heparin drip ,nitroglycerin when necessary, morphine when necessary, consult cardiology follows with unassigned 2. GERD well-controlled: She is been off of scheduled p.m. medications for about 3 years takes omeprazole if she gets a GERD flare if cardiac workup proves negative would restart daily omeprazole 3. Venous thrombi embolism prophylactic: Lovenox      All the records are reviewed and case discussed with ED provider. Management plans discussed with the patient, family and they are in agreement.  CODE STATUS: Full  TOTAL TIME TAKING CARE OF THIS PATIENT: 33 minutes.    Donda Friedli,  Karenann Cai.D on 07/21/2015 at 11:43 AM  Between 7am to 6pm - Pager - [phone]  After 6pm: House Pager: - [phone]  [facility]  Office  [phone]  CC: Primary care physician; [name], MD

## 2015-07-21 NOTE — Consult Note (Signed)
[facility] Cardiology Consultation Note  Patient ID: Melissa Trujillo, MRN: [id number], DOB/AGE: February 13, 1945 70 y.o. Admit date: 07/21/2015   Date of Consult: 07/21/2015 Primary Physician: [name], MD Primary Cardiologist: Atypical chest pain  Chief Complaint:  Chief Complaint  Patient presents with  . Chest Pain   Reason for Consult: chest pain  HPI: 70 y.o. female with no evidence of previous cardiovascular risk factors and no current evidence of hypertension hyperlipidemia and diabetes having a progression issues of the chest discomfort pressure and burping. This comes only with food when the patient is eating or drinking anything. She cannot keep anything down and has to wait for long periods of time before her pressure and in groups. The patient has had a recent stress test about 3 years ago due to this setting type of chest pressure and did well with no evidence of ischemia. No other diagnostic testing has been done. The patient is very active with physical activity having no symptoms with activity. The patient does have current symptoms more consistent with achalasia and/or esophageal spasm. Currently her EKG is normal with normal troponin and has no further symptoms  Past Medical History  Diagnosis Date  . 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 Urinary tract infection       Surgical History:  Past Surgical History  Procedure Laterality Date  . Ankle surgery    . Shoulder arthroscopy with rotator cuff repair Bilateral   . Toe surgery Right     right prosthesis  . Abdominal hysterectomy    . Appendectomy    . Moes surgery      Left temporal area  . Carcnioma       Home Meds: Prior to Admission medications   Medication Sig Start Date End Date Taking? Authorizing Provider  calcium carbonate (TUMS - DOSED IN MG ELEMENTAL CALCIUM) 500 MG chewable tablet Chew 1 tablet by mouth as needed for indigestion or heartburn.   Yes Historical Provider, MD  lactobacillus acidophilus  (BACID) TABS tablet Take 1 tablet by mouth daily.    Yes Historical Provider, MD  Multiple Vitamin (MULTIVITAMIN) tablet Take 1 tablet by mouth daily.   Yes Historical Provider, MD  Omega-3 Fatty Acids (FISH OIL) 1000 MG CAPS Take 1,000 mg by mouth daily.    Yes Historical Provider, MD    Inpatient Medications:  . aspirin EC  81 mg Oral Daily  . enoxaparin (LOVENOX) injection  40 mg Subcutaneous Q24H  . pantoprazole  40 mg Oral Daily  . sodium chloride flush  3 mL Intravenous Q12H      Allergies:  Allergies  Allergen Reactions  . Sulfa Antibiotics Hives    Social History   Social History  . Marital Status: Married    Spouse Name: N/A  . Number of Children: N/A  . Years of Education: N/A   Occupational History  . Not on file.   Social History Main Topics  . Smoking status: Never Smoker   . Smokeless tobacco: Not on file  . Alcohol Use: 0.0 oz/week    0 Standard drinks or equivalent per week  . Drug Use: No  . Sexual Activity: Not on file   Other Topics Concern  . Not on file   Social History Narrative     Family History  Problem Relation Age of Onset  . Diabetes Daughter      Review of Systems Positive for Chest discomfort Negative for: General:  chills, fever, night sweats or weight changes.  Cardiovascular: PND orthopnea syncope dizziness  Dermatological skin lesions rashes Respiratory: Cough congestion Urologic: Frequent urination urination at night and hematuria Abdominal: negative for nausea, vomiting, diarrhea, bright red blood per rectum, melena, or hematemesis Neurologic: negative for visual changes, and/or hearing changes  All other systems reviewed and are otherwise negative except as noted above.  Labs:  Recent Labs  07/21/15 1028 07/21/15 1437  TROPONINI <0.03 <0.03   Lab Results  Component Value Date   WBC 5.7 07/21/2015   HGB 14.5 07/21/2015   HCT 42.3 07/21/2015   MCV 90.9 07/21/2015   PLT 267 07/21/2015    Recent Labs Lab  07/21/15 1028  NA 141  K 3.8  CL 106  CO2 26  BUN 13  CREATININE 0.72  CALCIUM 10.4*  GLUCOSE 131*   No results found for: CHOL, HDL, LDLCALC, TRIG No results found for: DDIMER  Radiology/Studies:  Dg Chest Portable 1 View  07/21/2015  CLINICAL DATA:  Chest pain for 1 day EXAM: PORTABLE CHEST 1 VIEW COMPARISON:  None. FINDINGS: There is no edema or consolidation. The heart size and pulmonary vascularity are normal. No adenopathy. There is calcification in the aortic arch region. There is postoperative change in the right shoulder. IMPRESSION: Aortic atherosclerosis.  No edema or consolidation. Electronically Signed   By: Lowella Grip III M.D.   On: 07/21/2015 11:16    EKG: Normal sinus rhythm  Weights: Filed Weights   07/21/15 1022 07/21/15 1243  Weight: 135 lb (61.236 kg) 141 lb 1.6 oz (64.003 kg)     Physical Exam: Blood pressure 158/65, pulse 65, temperature 98.3 F (36.8 C), temperature source Oral, resp. rate 17, height 5\' 3"  (1.6 m), weight 141 lb 1.6 oz (64.003 kg), SpO2 99 %. Body mass index is 25 kg/(m^2). General: Well developed, well nourished, in no acute distress. Head eyes ears nose throat: Normocephalic, atraumatic, sclera non-icteric, no xanthomas, nares are without discharge. No apparent thyromegaly and/or mass  Lungs: Normal respiratory effort.  no wheezes, no rales, no rhonchi.  Heart: RRR with normal S1 S2. no murmur gallop, no rub, PMI is normal size and placement, carotid upstroke normal without bruit, jugular venous pressure is normal Abdomen: Soft, non-tender, non-distended with normoactive bowel sounds. No hepatomegaly. No rebound/guarding. No obvious abdominal masses. Abdominal aorta is normal size without bruit Extremities: No edema. no cyanosis, no clubbing, no ulcers  Peripheral : 2+ bilateral upper extremity pulses, 2+ bilateral femoral pulses, 2+ bilateral dorsal pedal pulse Neuro: Alert and oriented. No facial asymmetry. No focal deficit.  Moves all extremities spontaneously. Musculoskeletal: Normal muscle tone without kyphosis Psych:  Responds to questions appropriately with a normal affect.    Assessment: 70 year old female with no evidence of cardiovascular risk factors having chest discomfort more consistent with achalasia or esophageal abnormalities only with oral intake rather than activities. There is no current evidence of congestive heart failure true angina and or myocardial infarction  Plan: 1. No further cardiac diagnostics and her cardiac workup at this time due to no evidence of myocardial infarction heart failure and atypical chest discomfort 2. Proceed to upper endoscopy and/or further evaluation of possible achalasia or other esophageal abnormalities as per gastroenterology. Patient is at lowest risk possible at this time due to no evidence of acute coronary syndrome.  3. Continue ambulation as able and follow for further symptoms and call if further questions  Signed, Corey Skains M.D. [facility] 07/21/2015, 6:29 PM

## 2015-07-21 NOTE — Progress Notes (Signed)
70 year old woman admitted through the ED with chest pain, SOB, and diaphoresis. Chest pain and SOB have since resolved.  Initial troponin value was negative ( <0.03).  Patient admitted for observation and further evaluation of troponin levels.  Patient is alert and oriented. Denies pain at this time. Oriented to room and unit routines.

## 2015-07-21 NOTE — ED Notes (Signed)
Pt to ed via [facility] from [facility] for chest pain that started at 7 am today.  Pt reports sob, diaphoresis and pain in back and right jaw.  Pt states she has a hx of same and usually takes tums for relief.

## 2015-07-21 NOTE — ED Provider Notes (Signed)
[facility]  ____________________________________________  Time seen: Approximately 9:32 AM  I have reviewed the triage vital signs and the nursing notes.   HISTORY  Chief Complaint Chest Pain  HPI Melissa Trujillo is a 70 y.o. female  presents with husband at bedside for the complaints of mid chest pain and chest pressure. Patient reports pain onset was at 7 AM this morning. Patient states that she was already up and active. Patient reports that initially the pain was in her mid lower chest and states she felt like it was indigestion. Patient states that she felt that she would do or to belch it would resolve. Patient states that she was able to belch without any change in her pain.  States tums did not improve her complaints. Patient states that the pain gradually worsened over the next hour to 2 hours and states that it moved upward into her chest. Patient states she is having mid chest pressure and pain that also radiates in between her posterior shoulder blades. Patient also reports pain in her right neck and jaw. States pain now 5/10 but slightly improving.  Denies any history of similar. Denies any fall or trauma. Denies recent surgeries. Denies recent sickness, fevers, cough or congestion. Patient reports that she does have a history of GERD and indigestion but states that she does not normally have pain at this level or these locations with her ingestion. Patient denies any history of hypertension, smoking, hyperlipidemia, diabetes, coronary artery disease.  PCP: Plonk    Past Medical History  Diagnosis Date  . Squamous cell carcinoma ([place])   . Urinary tract infection     Patient Active Problem List   Diagnosis Date Noted  . FH: colon cancer 09/27/2014  . FH: COPD (chronic obstructive pulmonary disease) 09/27/2014  . FH: bipolar disorder 09/27/2014  . Prediabetes 09/26/2014  . Vitamin D insufficiency 09/26/2014  . Insomnia 09/25/2014  . Right foot pain 09/25/2014   . Hx of basal cell carcinoma 09/25/2014    Past Surgical History  Procedure Laterality Date  . Ankle surgery    . Shoulder arthroscopy with rotator cuff repair Bilateral   . Toe surgery Right     right prosthesis  . Abdominal hysterectomy    . Appendectomy    . Moes surgery      Left temporal area  . [place]      Current Outpatient Rx  Name  Route  Sig  Dispense  Refill  . Cholecalciferol (VITAMIN D) 2000 UNITS CAPS   Oral   Take 1 capsule (2,000 Units total) by mouth daily.   30 capsule      . lactobacillus acidophilus (BACID) TABS tablet   Oral   Take 2 tablets by mouth 3 (three) times daily.         . Multiple Vitamin (MULTIVITAMIN) tablet   Oral   Take 1 tablet by mouth daily.         . Omega-3 Fatty Acids (FISH OIL) 1000 MG CAPS   Oral   Take by mouth.         .           .             Allergies Sulfa antibiotics  Family History  Problem Relation Age of Onset  . Diabetes Daughter   Brother deceased from suicide Sister alive depression Mother deceased emphysema.  Social History Social History  Substance Use Topics  . Smoking status: Never Smoker   .  Smokeless tobacco: None  . Alcohol Use: 0.0 oz/week    0 Standard drinks or equivalent per week    Review of Systems Constitutional: No fever/chills Eyes: No visual changes. ENT: No sore throat. Cardiovascular: As above. Respiratory: Denies shortness of breath. Gastrointestinal: No abdominal pain.  No nausea, no vomiting.  No diarrhea.  No constipation. Genitourinary: Negative for dysuria. Musculoskeletal: Negative for back pain. Skin: Negative for rash. Neurological: Negative for headaches, focal weakness or numbness.  10-point ROS otherwise negative.  ____________________________________________   PHYSICAL EXAM:  VITAL SIGNS: ED Triage Vitals  Enc Vitals Group     BP 07/21/15 0918 184/81 mmHg     Pulse Rate 07/21/15 0918 73     Resp --      Temp --      Temp src --       SpO2 --      Weight --      Height --      Head Cir --      Peak Flow --      Pain Score 07/21/15 0926 10     Pain Loc --      Pain Edu? --      Excl. in [place]? --     Constitutional: Alert and oriented. Well appearing and in no acute distress. Eyes: Conjunctivae are normal. PERRL. EOMI. Head: Atraumatic.  Ears: normal external appearance bilaterally.   Nose: No congestion/rhinnorhea.  Mouth/Throat: Mucous membranes are moist.  Oropharynx non-erythematous. Neck: No stridor.  No cervical spine tenderness to palpation. Hematological/Lymphatic/Immunilogical: No cervical lymphadenopathy. Cardiovascular: Normal rate, regular rhythm. Grossly normal heart sounds.  Good peripheral circulation. Respiratory: Normal respiratory effort.  No retractions. Lungs CTAB. No wheezes, rales or rhonchi.  Gastrointestinal: Soft and nontender. No distention. Musculoskeletal: No lower or upper extremity tenderness nor edema.   Bilateral pedal pulses equal and easily palpated. No cervical, thoracic or lumbar tenderness to palpation. Chest and back pain nonreproducible.  Neurologic:  Normal speech and language. No gross focal neurologic deficits are appreciated. Sensation intact to bilateral upper and lower extremities.  Skin:  Skin is warm, dry and intact. No rash noted. Psychiatric: Mood and affect are normal. Speech and behavior are normal.  ____________________________________________   LABS (all labs ordered are listed, but only abnormal results are displayed)  Labs Reviewed - No data to display ____________________________________________  EKG  ED ECG REPORT I, Marylene Land, the attending provider and Dr Zenda Alpers, personally viewed and interpreted this ECG.  Date: 07/21/2015 EKG Time: 0919 Rate: 71  Rhythm: normal sinus rhythm QRS Axis: left axis deviation Intervals: normal ST/T Wave abnormalities: normal Conduction Disturbances:  none  ____________________________________________  RADIOLOGY  No results found.  INITIAL IMPRESSION / ASSESSMENT AND PLAN / ED COURSE  Pertinent labs & imaging results that were available during my care of the patient were reviewed by me and considered in my medical decision making (see chart for details).  Overall well-appearing patient. No acute distress. Has been bedside. Presents for complaints of mid chest pain, chest pressure with associated right jaw pain and shoulder blade pain. Denies trauma. Denies known trigger. Discussed in detail with patient and spouse at bedside due to concern for possible cardiac etiology recommendation be seen in further evaluated emergency room at this time. Recommend EMS transfer. Patient and has been agreeable to this. EMS called. 324 mg aspirin given. 2 L [place] O2 applied.  Report given to him as at bedside. Patient stable the time of EMS transfer. Marya Amsler RN treasures  [place] [facility] report called and given to.  Discussed follow up with Primary care physician this week. Discussed follow up and return parameters including no resolution or any worsening concerns. Patient verbalized understanding and agreed to plan.   ____________________________________________   FINAL CLINICAL IMPRESSION(S) / ED DIAGNOSES  Final diagnoses:  Chest pain, unspecified chest pain type     New Prescriptions   No medications on file    Note: This dictation was prepared with Dragon dictation along with smaller phrase technology. Any transcriptional errors that result from this process are unintentional.       Marylene Land, NP 07/21/15 1030  Marylene Land, NP 07/21/15 1031

## 2015-07-21 NOTE — ED Notes (Signed)
Patient states that pain started around 7am this morning. States that pain is sharp that radiates through the back. Patient also states that she has some radiation of pain into her jaw. Patient describes chest pain as "someone is sitting on her chest".

## 2015-07-22 MED ORDER — PANTOPRAZOLE SODIUM 40 MG PO TBEC: 40.0000 mg | DELAYED_RELEASE_TABLET | Freq: Two times a day (BID) | ORAL | Status: DC

## 2015-07-22 NOTE — Discharge Instructions (Signed)
Follow with GI clinic in 1 week to set up endoscopy.

## 2015-07-22 NOTE — Progress Notes (Signed)
Discharge instructions given. IV and tele removed. Protonix prescription sent to [facility] in [place]. Education given on med and acid reflux. Patient will follow up with Dr. Dorothey Baseman office tomorrow morning. Husband is here to take her home.

## 2015-07-23 ENCOUNTER — Encounter: Payer: Self-pay | Admitting: Gastroenterology

## 2015-07-23 ENCOUNTER — Other Ambulatory Visit: Payer: Self-pay

## 2015-07-23 ENCOUNTER — Ambulatory Visit (INDEPENDENT_AMBULATORY_CARE_PROVIDER_SITE_OTHER): Payer: BLUE CROSS/BLUE SHIELD | Admitting: Gastroenterology

## 2015-07-23 VITALS — BP 165/83 | HR 125 | Temp 98.1°F | Ht 63.0 in | Wt 140.0 lb

## 2015-07-23 DIAGNOSIS — R0789 Other chest pain: Secondary | ICD-10-CM

## 2015-07-23 DIAGNOSIS — K219 Gastro-esophageal reflux disease without esophagitis: Secondary | ICD-10-CM | POA: Diagnosis not present

## 2015-07-23 NOTE — Progress Notes (Signed)
Gastroenterology Consultation  Referring Provider:     [name], MD Primary Care Physician:  [name], MD Primary Gastroenterologist:  Dr. Allen Norris     Reason for Consultation:     Chest discomfort        HPI:   Melissa Trujillo is a 70 y.o. y/o female referred for consultation & management of Chest discomfort by Dr. [name], MD.  This patient comes today with a history of admission to the emergency room for observation due to chest pain. The patient reports it to be morbidly discomfort. The patient was seen by cardiology who told her that she likely has GI symptoms and not cardiac symptoms. The patient was being followed in [place] by a gastroenterologist. The patient states she's had long-standing reflux and was on a PPI in the past. The patient now reports that her symptoms are not burning anymore but she has chest discomfort. The patient has had burning the past which she reports it burned all the way up to her years. The patient's symptoms now are at night when she lays down she gets some chest discomfort which is relieved by her taking Tums and sometimes walking around which makes it subside. There is no report of any unexplained weight loss black stools or bloody stools she also denies any fevers or chills.  Past Medical History  Diagnosis Date  . Squamous cell carcinoma ([place])   . Urinary tract infection     Past Surgical History  Procedure Laterality Date  . Ankle surgery    . Shoulder arthroscopy with rotator cuff repair Bilateral   . Toe surgery Right     right prosthesis  . Abdominal hysterectomy    . Appendectomy    . Moes surgery      Left temporal area  . Carcnioma      Prior to Admission medications   Medication Sig Start Date End Date Taking? Authorizing Provider  calcium carbonate (TUMS - DOSED IN MG ELEMENTAL CALCIUM) 500 MG chewable tablet Chew 1 tablet by mouth as needed for indigestion or heartburn.   Yes Historical Provider, MD  lactobacillus  acidophilus (BACID) TABS tablet Take 1 tablet by mouth daily.    Yes Historical Provider, MD  Multiple Vitamin (MULTIVITAMIN) tablet Take 1 tablet by mouth daily.   Yes Historical Provider, MD  Omega-3 Fatty Acids (FISH OIL) 1000 MG CAPS Take 1,000 mg by mouth daily.    Yes Historical Provider, MD    Family History  Problem Relation Age of Onset  . Diabetes Daughter   . COPD Mother   . Cancer - Colon Father      Social History  Substance Use Topics  . Smoking status: Never Smoker   . Smokeless tobacco: None  . Alcohol Use: 0.0 oz/week    0 Standard drinks or equivalent per week    Allergies as of 07/23/2015 - Review Complete 07/23/2015  Allergen Reaction Noted  . Sulfa antibiotics Hives 03/29/2015    Review of Systems:    All systems reviewed and negative except where noted in HPI.   Physical Exam:  BP 165/83 mmHg  Pulse 125  Temp(Src) 98.1 F (36.7 C) (Oral)  Ht 5\' 3"  (1.6 m)  Wt 140 lb (63.504 kg)  BMI 24.81 kg/m2 No LMP recorded. Patient is postmenopausal. Psych:  Alert and cooperative. Normal mood and affect. General:   Alert,  Well-developed, well-nourished, pleasant and cooperative in NAD Head:  Normocephalic and atraumatic. Eyes:  Sclera clear, no icterus.  Conjunctiva pink. Ears:  Normal auditory acuity. Nose:  No deformity, discharge, or lesions. Mouth:  No deformity or lesions,oropharynx pink & moist. Neck:  Supple; no masses or thyromegaly. Lungs:  Respirations even and unlabored.  Clear throughout to auscultation.   No wheezes, crackles, or rhonchi. No acute distress. Heart:  Regular rate and rhythm; no murmurs, clicks, rubs, or gallops. Abdomen:  Normal bowel sounds.  No bruits.  Soft, non-tender and non-distended without masses, hepatosplenomegaly or hernias noted.  No guarding or rebound tenderness.  Negative Carnett sign.   Rectal:  Deferred.  Msk:  Symmetrical without gross deformities.  Good, equal movement & strength bilaterally. Pulses:  Normal  pulses noted. Extremities:  No clubbing or edema.  No cyanosis. Neurologic:  Alert and oriented x3;  grossly normal neurologically. Skin:  Intact without significant lesions or rashes.  No jaundice. Lymph Nodes:  No significant cervical adenopathy. Psych:  Alert and cooperative. Normal mood and affect.  Imaging Studies: Dg Chest Portable 1 View  07/21/2015  CLINICAL DATA:  Chest pain for 1 day EXAM: PORTABLE CHEST 1 VIEW COMPARISON:  None. FINDINGS: There is no edema or consolidation. The heart size and pulmonary vascularity are normal. No adenopathy. There is calcification in the aortic arch region. There is postoperative change in the right shoulder. IMPRESSION: Aortic atherosclerosis.  No edema or consolidation. Electronically Signed   By: Lowella Grip III M.D.   On: 07/21/2015 11:16    Assessment and Plan:   Melissa Trujillo is a 70 y.o. y/o female who comes in today with a history of chest discomfort. The patient is likely suffering from acid reflux. The patient will be set up for an upper endoscopy. She will also be set up to start a trial of Dexilant 60 mg a day to see if her symptoms improve. The patient also reports that she is due for colonoscopy and hasn't had a colonoscopy in 10 years. The patient will also be set up for screening colonoscopy.I have discussed risks & benefits which include, but are not limited to, bleeding, infection, perforation & drug reaction.  The patient agrees with this plan & written consent will be obtained.      Note: This dictation was prepared with Dragon dictation along with smaller phrase technology. Any transcriptional errors that result from this process are unintentional.

## 2015-07-30 NOTE — ED Provider Notes (Signed)
CSN: [id number]     Arrival date & time 05/30/15  0849 History   First MD Initiated Contact with Patient 05/30/15 [phone]     Chief Complaint  Patient presents with  . Insect Bite   (Consider location/radiation/quality/duration/timing/severity/associated sxs/prior Treatment) HPI Comments: 70 yo female with a c/o progressively worsening insect bites to the right inner thigh. Denies any fevers, chills, drainage.  The history is provided by the patient.    Past Medical History  Diagnosis Date  . Squamous cell carcinoma ([place])   . Urinary tract infection   . GERD (gastroesophageal reflux disease)    Past Surgical History  Procedure Laterality Date  . Ankle surgery    . Shoulder arthroscopy with rotator cuff repair Bilateral   . Toe surgery Right     right prosthesis  . Abdominal hysterectomy    . Appendectomy    . Moes surgery      Left temporal area  . Carcnioma     Family History  Problem Relation Age of Onset  . Diabetes Daughter   . COPD Mother   . Cancer - Colon Father    Social History  Substance Use Topics  . Smoking status: Never Smoker   . Smokeless tobacco: None  . Alcohol Use: 0.6 oz/week    1 Glasses of wine, 0 Standard drinks or equivalent per week   OB History    No data available     Review of Systems  Allergies  Sulfa antibiotics and Latex  Home Medications   Prior to Admission medications   Medication Sig Start Date End Date Taking? Authorizing Provider  calcium carbonate (TUMS - DOSED IN MG ELEMENTAL CALCIUM) 500 MG chewable tablet Chew 1 tablet by mouth as needed for indigestion or heartburn.    Historical Provider, MD  Dexlansoprazole (DEXILANT PO) Take by mouth 1 day or 1 dose.    Historical Provider, MD  lactobacillus acidophilus (BACID) TABS tablet Take 1 tablet by mouth daily.     Historical Provider, MD  Multiple Vitamin (MULTIVITAMIN) tablet Take 1 tablet by mouth daily.    Historical Provider, MD  Omega-3 Fatty Acids (FISH OIL) 1000 MG  CAPS Take 1,000 mg by mouth daily.     Historical Provider, MD   Meds Ordered and Administered this Visit  Medications - No data to display  BP 155/72 mmHg  Pulse 98  Temp(Src) 97.4 F (36.3 C) (Tympanic)  Resp 16  Ht 5\' 3"  (1.6 m)  Wt 135 lb (61.236 kg)  BMI 23.92 kg/m2  SpO2 98% No data found.   Physical Exam  Constitutional: She appears well-developed and well-nourished. No distress.  Skin: She is not diaphoretic.  3 discreet puncture wounds with surrounding blanchable skin erythema, warmth and tenderness to palpation on left inner thigh  Nursing note and vitals reviewed.   ED Course  Procedures (including critical care time)  Labs Review Labs Reviewed - No data to display  Imaging Review No results found.   Visual Acuity Review  Right Eye Distance:   Left Eye Distance:   Bilateral Distance:    Right Eye Near:   Left Eye Near:    Bilateral Near:         MDM   1. Insect bites   2. Cellulitis of right upper extremity   3. Cellulitis of left lower extremity    Discharge Medication List as of 05/30/2015 10:04 AM    START taking these medications   Details  cephALEXin (KEFLEX) 500 MG  capsule Take 1 capsule (500 mg total) by mouth 3 (three) times daily., Starting 05/30/2015, Until Discontinued, Normal       1.  diagnosis reviewed with patient 2. rx as per orders above; reviewed possible side effects, interactions, risks and benefits  3. Recommend supportive treatment with warm compresses 4. Follow-up prn if symptoms worsen or don't improve    Norval Gable, MD 07/30/15 1657

## 2015-07-30 NOTE — Discharge Instructions (Signed)

## 2015-07-31 ENCOUNTER — Ambulatory Visit: Payer: BLUE CROSS/BLUE SHIELD | Admitting: Anesthesiology

## 2015-07-31 ENCOUNTER — Ambulatory Visit
Admission: RE | Admit: 2015-07-31 | Discharge: 2015-07-31 | Disposition: A | Discharge status: Home / Self Care | Payer: BLUE CROSS/BLUE SHIELD | Source: Ambulatory Visit | Attending: Gastroenterology | Admitting: Gastroenterology

## 2015-07-31 ENCOUNTER — Encounter
Admission: RE | Disposition: A | Discharge status: Home / Self Care | Payer: Self-pay | Source: Ambulatory Visit | Attending: Gastroenterology

## 2015-07-31 DIAGNOSIS — R12 Heartburn: Secondary | ICD-10-CM | POA: Diagnosis present

## 2015-07-31 DIAGNOSIS — K222 Esophageal obstruction: Secondary | ICD-10-CM | POA: Diagnosis not present

## 2015-07-31 DIAGNOSIS — D120 Benign neoplasm of cecum: Secondary | ICD-10-CM

## 2015-07-31 DIAGNOSIS — Z1211 Encounter for screening for malignant neoplasm of colon: Secondary | ICD-10-CM

## 2015-07-31 DIAGNOSIS — K2970 Gastritis, unspecified, without bleeding: Secondary | ICD-10-CM

## 2015-07-31 DIAGNOSIS — K219 Gastro-esophageal reflux disease without esophagitis: Secondary | ICD-10-CM | POA: Diagnosis not present

## 2015-07-31 DIAGNOSIS — K2950 Unspecified chronic gastritis without bleeding: Secondary | ICD-10-CM | POA: Insufficient documentation

## 2015-07-31 DIAGNOSIS — K641 Second degree hemorrhoids: Secondary | ICD-10-CM | POA: Diagnosis not present

## 2015-07-31 DIAGNOSIS — Z79899 Other long term (current) drug therapy: Secondary | ICD-10-CM | POA: Diagnosis not present

## 2015-07-31 DIAGNOSIS — K2980 Duodenitis without bleeding: Secondary | ICD-10-CM

## 2015-07-31 HISTORY — PX: POLYPECTOMY: SHX5525

## 2015-07-31 HISTORY — PX: ESOPHAGOGASTRODUODENOSCOPY (EGD) WITH PROPOFOL: SHX5813

## 2015-07-31 HISTORY — DX: Gastro-esophageal reflux disease without esophagitis: K21.9

## 2015-07-31 HISTORY — PX: COLONOSCOPY WITH PROPOFOL: SHX5780

## 2015-07-31 SURGERY — COLONOSCOPY WITH PROPOFOL
Anesthesia: Monitor Anesthesia Care | Wound class: Contaminated

## 2015-07-31 MED ORDER — LACTATED RINGERS IV SOLN
INTRAVENOUS | Status: DC
  Administered 2015-07-31: 07:00:00 via INTRAVENOUS

## 2015-07-31 MED ORDER — ONDANSETRON HCL 4 MG/2ML IJ SOLN: 4.0000 mg | Freq: Once | INTRAMUSCULAR | Status: DC | PRN

## 2015-07-31 MED ORDER — PROPOFOL 10 MG/ML IV BOLUS
INTRAVENOUS | Status: DC | PRN
  Administered 2015-07-31: 30 mg via INTRAVENOUS
  Administered 2015-07-31: 20 mg via INTRAVENOUS
  Administered 2015-07-31: 30 mg via INTRAVENOUS
  Administered 2015-07-31: 70 mg via INTRAVENOUS
  Administered 2015-07-31 (×6): 20 mg via INTRAVENOUS
  Administered 2015-07-31: 30 mg via INTRAVENOUS

## 2015-07-31 MED ORDER — GLYCOPYRROLATE 0.2 MG/ML IJ SOLN
INTRAMUSCULAR | Status: DC | PRN
  Administered 2015-07-31: 0.2 mg via INTRAVENOUS

## 2015-07-31 MED ORDER — ACETAMINOPHEN 160 MG/5ML PO SOLN: 325.0000 mg | ORAL | Status: DC | PRN

## 2015-07-31 MED ORDER — ACETAMINOPHEN 325 MG PO TABS: 325.0000 mg | ORAL_TABLET | ORAL | Status: DC | PRN

## 2015-07-31 MED ORDER — STERILE WATER FOR IRRIGATION IR SOLN
Status: DC | PRN
  Administered 2015-07-31: 09:00:00

## 2015-07-31 MED ORDER — LIDOCAINE HCL (CARDIAC) 20 MG/ML IV SOLN
INTRAVENOUS | Status: DC | PRN
  Administered 2015-07-31: 50 mg via INTRAVENOUS

## 2015-07-31 SURGICAL SUPPLY — 35 items
BALLN DILATOR 10-12 8 (BALLOONS)
BALLN DILATOR 12-15 8 (BALLOONS)
BALLN DILATOR 15-18 8 (BALLOONS)
BALLN DILATOR CRE 0-12 8 (BALLOONS)
BALLN DILATOR ESOPH 8 10 CRE (MISCELLANEOUS) IMPLANT
BALLOON DILATOR 12-15 8 (BALLOONS) IMPLANT
BALLOON DILATOR 15-18 8 (BALLOONS) IMPLANT
BALLOON DILATOR CRE 0-12 8 (BALLOONS) IMPLANT
BLOCK BITE 60FR ADLT L/F GRN (MISCELLANEOUS) ×4 IMPLANT
CANISTER SUCT 1200ML W/VALVE (MISCELLANEOUS) ×4 IMPLANT
CLIP HMST 235XBRD CATH ROT (MISCELLANEOUS) IMPLANT
CLIP RESOLUTION 360 11X235 (MISCELLANEOUS)
FCP ESCP3.2XJMB 240X2.8X (MISCELLANEOUS)
FORCEPS BIOP RAD 4 LRG CAP 4 (CUTTING FORCEPS) ×4 IMPLANT
FORCEPS BIOP RJ4 240 W/NDL (MISCELLANEOUS)
FORCEPS ESCP3.2XJMB 240X2.8X (MISCELLANEOUS) IMPLANT
GOWN CVR UNV OPN BCK APRN NK (MISCELLANEOUS) ×4 IMPLANT
GOWN ISOL THUMB LOOP REG UNIV (MISCELLANEOUS) ×4
INJECTOR VARIJECT VIN23 (MISCELLANEOUS) IMPLANT
KIT DEFENDO VALVE AND CONN (KITS) IMPLANT
KIT ENDO PROCEDURE OLY (KITS) ×4 IMPLANT
MARKER SPOT ENDO TATTOO 5ML (MISCELLANEOUS) IMPLANT
PAD GROUND ADULT SPLIT (MISCELLANEOUS) IMPLANT
PROBE APC STR FIRE (PROBE) IMPLANT
RETRIEVER NET PLAT FOOD (MISCELLANEOUS) IMPLANT
RETRIEVER NET ROTH 2.5X230 LF (MISCELLANEOUS) ×4 IMPLANT
SNARE SHORT THROW 13M SML OVAL (MISCELLANEOUS) IMPLANT
SNARE SHORT THROW 30M LRG OVAL (MISCELLANEOUS) IMPLANT
SNARE SNG USE RND 15MM (INSTRUMENTS) IMPLANT
SPOT EX ENDOSCOPIC TATTOO (MISCELLANEOUS)
SYR INFLATION 60ML (SYRINGE) IMPLANT
TRAP ETRAP POLY (MISCELLANEOUS) IMPLANT
VARIJECT INJECTOR VIN23 (MISCELLANEOUS)
WATER STERILE IRR 250ML POUR (IV SOLUTION) ×4 IMPLANT
WIRE CRE 18-20MM 8CM F G (MISCELLANEOUS) IMPLANT

## 2015-07-31 NOTE — Transfer of Care (Signed)
Immediate Anesthesia Transfer of Care Note  Patient: Melissa Trujillo  Procedure(s) Performed: Procedure(s): COLONOSCOPY WITH PROPOFOL (N/A) ESOPHAGOGASTRODUODENOSCOPY (EGD) WITH PROPOFOL with dialation (N/A) POLYPECTOMY  Patient Location: PACU  Anesthesia Type: MAC  Level of Consciousness: awake, alert  and patient cooperative  Airway and Oxygen Therapy: Patient Spontanous Breathing and Patient connected to supplemental oxygen  Post-op Assessment: Post-op Vital signs reviewed, Patient's Cardiovascular Status Stable, Respiratory Function Stable, Patent Airway and No signs of Nausea or vomiting  Post-op Vital Signs: Reviewed and stable  Complications: No apparent anesthesia complications

## 2015-07-31 NOTE — Anesthesia Postprocedure Evaluation (Signed)
Anesthesia Post Note  Patient: Melissa Trujillo  Procedure(s) Performed: Procedure(s) (LRB): COLONOSCOPY WITH PROPOFOL (N/A) ESOPHAGOGASTRODUODENOSCOPY (EGD) WITH PROPOFOL with dialation (N/A) POLYPECTOMY  Patient location during evaluation: PACU Anesthesia Type: MAC Level of consciousness: awake and alert and oriented Pain management: pain level controlled Vital Signs Assessment: post-procedure vital signs reviewed and stable Respiratory status: spontaneous breathing and nonlabored ventilation Cardiovascular status: stable Postop Assessment: no signs of nausea or vomiting and adequate PO intake Anesthetic complications: no    Estill Batten

## 2015-07-31 NOTE — Anesthesia Procedure Notes (Signed)
Procedure Name: MAC Performed by: Aesha Agrawal Pre-anesthesia Checklist: Patient identified, Emergency Drugs available, Suction available, Timeout performed and Patient being monitored Patient Re-evaluated:Patient Re-evaluated prior to inductionOxygen Delivery Method: Nasal cannula Placement Confirmation: positive ETCO2       

## 2015-07-31 NOTE — Op Note (Signed)
[facility] Gastroenterology Patient Name: Melissa Trujillo Procedure Date: 07/31/2015 8:24 AM MRN: [id number] Account #: [id number] Date of Birth: June 21, 1945 Admit Type: Outpatient Age: 70 Room: [facility] OR ROOM 01 Gender: Female Note Status: Finalized Procedure:            Upper GI endoscopy Indications:          Heartburn Providers:            Lucilla Lame MD, MD Referring MD:         Satira Anis. Plonk, MD (Referring MD) Medicines:            Propofol per Anesthesia Complications:        No immediate complications. Procedure:            Pre-Anesthesia Assessment:                       - Prior to the procedure, a History and Physical was                        performed, and patient medications and allergies were                        reviewed. The patient's tolerance of previous                        anesthesia was also reviewed. The risks and benefits of                        the procedure and the sedation options and risks were                        discussed with the patient. All questions were                        answered, and informed consent was obtained. Prior                        Anticoagulants: The patient has taken no previous                        anticoagulant or antiplatelet agents. ASA Grade                        Assessment: II - A patient with mild systemic disease.                        After reviewing the risks and benefits, the patient was                        deemed in satisfactory condition to undergo the                        procedure.                       After obtaining informed consent, the endoscope was                        passed under direct vision. Throughout the procedure,  the patient's blood pressure, pulse, and oxygen                        saturations were monitored continuously. The Olympus                        [id number]-HQ190 Endoscope (S#. [id number]) was introduced                        through  the mouth, and advanced to the second part of                        duodenum. The upper GI endoscopy was accomplished                        without difficulty. The patient tolerated the procedure                        well. Findings:      One mild benign-appearing, intrinsic stenosis was found at the       gastroesophageal junction. And was traversed. A TTS dilator was passed       through the scope. Dilation with a 15-16.5-18 mm balloon dilator was       performed to 18 mm. The dilation site was examined and showed complete       resolution of luminal narrowing.      Localized mild inflammation characterized by erythema was found in the       gastric antrum. Biopsies were taken with a cold forceps for histology.      Localized mild inflammation characterized by erythema was found in the       duodenal bulb. Impression:           - Benign-appearing esophageal stenosis. Dilated.                       - Gastritis. Biopsied.                       - Duodenitis. Recommendation:       - Await pathology results.                       - Perform a colonoscopy today. Procedure Code(s):    --- Professional ---                       [id number], Esophagogastroduodenoscopy, flexible, transoral;                        with transendoscopic balloon dilation of esophagus                        (less than 30 mm diameter)                       43239, Esophagogastroduodenoscopy, flexible, transoral;                        with biopsy, single or multiple Diagnosis Code(s):    --- Professional ---                       R12, Heartburn  K22.2, Esophageal obstruction                       K29.70, Gastritis, unspecified, without bleeding                       K29.80, Duodenitis without bleeding CPT copyright 2016 American Medical Association. All rights reserved. The codes documented in this report are preliminary and upon coder review may  be revised to meet current compliance  requirements. Lucilla Lame MD, MD 07/31/2015 8:48:39 AM This report has been signed electronically. Number of Addenda: 0 Note Initiated On: 07/31/2015 8:24 AM Total Procedure Duration: 0 hours 4 minutes 2 seconds       [facility]

## 2015-07-31 NOTE — Op Note (Signed)
[facility] Gastroenterology Patient Name: Melissa Trujillo Procedure Date: 07/31/2015 8:24 AM MRN: [id number] Account #: [id number] Date of Birth: Jul 15, 1945 Admit Type: Outpatient Age: 70 Room: [facility] OR ROOM 01 Gender: Female Note Status: Finalized Procedure:            Colonoscopy Indications:          Screening for colorectal malignant neoplasm Providers:            Lucilla Lame MD, MD Medicines:            Propofol per Anesthesia Complications:        No immediate complications. Procedure:            Pre-Anesthesia Assessment:                       - Prior to the procedure, a History and Physical was                        performed, and patient medications and allergies were                        reviewed. The patient's tolerance of previous                        anesthesia was also reviewed. The risks and benefits of                        the procedure and the sedation options and risks were                        discussed with the patient. All questions were                        answered, and informed consent was obtained. Prior                        Anticoagulants: The patient has taken no previous                        anticoagulant or antiplatelet agents. ASA Grade                        Assessment: II - A patient with mild systemic disease.                        After reviewing the risks and benefits, the patient was                        deemed in satisfactory condition to undergo the                        procedure.                       After obtaining informed consent, the colonoscope was                        passed under direct vision. Throughout the procedure,                        the patient's blood pressure,  pulse, and oxygen                        saturations were monitored continuously. The Olympus                        CF-HQ190L Colonoscope (S#. [id number]) was introduced                        through the anus and advanced to the  the cecum,                        identified by appendiceal orifice and ileocecal valve.                        The colonoscopy was performed without difficulty. The                        patient tolerated the procedure well. The quality of                        the bowel preparation was excellent. Findings:      The perianal and digital rectal examinations were normal.      A 3 mm polyp was found in the cecum. The polyp was sessile. The polyp       was removed with a cold biopsy forceps. Resection and retrieval were       complete.      Non-bleeding internal hemorrhoids were found during retroflexion. The       hemorrhoids were Grade II (internal hemorrhoids that prolapse but reduce       spontaneously). Impression:           - One 3 mm polyp in the cecum, removed with a cold                        biopsy forceps. Resected and retrieved.                       - Non-bleeding internal hemorrhoids. Recommendation:       - Await pathology results.                       - Repeat colonoscopy in 5 years if polyp adenoma and 10                        years if hyperplastic Procedure Code(s):    --- Professional ---                       [phone], Colonoscopy, flexible; with biopsy, single or                        multiple Diagnosis Code(s):    --- Professional ---                       Z12.11, Encounter for screening for malignant neoplasm                        of colon                       D12.0,  Benign neoplasm of cecum CPT copyright 2016 American [employer] Association. All rights reserved. The codes documented in this report are preliminary and upon coder review may  be revised to meet current compliance requirements. Lucilla Lame MD, MD 07/31/2015 9:01:00 AM This report has been signed electronically. Number of Addenda: 0 Note Initiated On: 07/31/2015 8:24 AM Scope Withdrawal Time: 0 hours 6 minutes 12 seconds  Total Procedure Duration: 0 hours 10 minutes 1 second       [facility]

## 2015-07-31 NOTE — H&P (Signed)
  Lucilla Lame, MD [address] [place], [place] [address] Phone: [phone] Fax : [phone]  Primary Care Physician:  [name], MD Primary Gastroenterologist:  Dr. Allen Norris  Pre-Procedure History & Physical: HPI:  Melissa Trujillo is a 70 y.o. female is here for an endoscopy and colonoscopy.   Past Medical History  Diagnosis Date  . 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 Urinary tract infection   . GERD (gastroesophageal reflux disease)     Past Surgical History  Procedure Laterality Date  . Ankle surgery    . Shoulder arthroscopy with rotator cuff repair Bilateral   . Toe surgery Right     right prosthesis  . Abdominal hysterectomy    . Appendectomy    . Moes surgery      Left temporal area  . Carcnioma      Prior to Admission medications   Medication Sig Start Date End Date Taking? Authorizing Provider  calcium carbonate (TUMS - DOSED IN MG ELEMENTAL CALCIUM) 500 MG chewable tablet Chew 1 tablet by mouth as needed for indigestion or heartburn.   Yes Historical Provider, MD  Dexlansoprazole (DEXILANT PO) Take by mouth 1 day or 1 dose.   Yes Historical Provider, MD  lactobacillus acidophilus (BACID) TABS tablet Take 1 tablet by mouth daily.    Yes Historical Provider, MD  Multiple Vitamin (MULTIVITAMIN) tablet Take 1 tablet by mouth daily.   Yes Historical Provider, MD  Omega-3 Fatty Acids (FISH OIL) 1000 MG CAPS Take 1,000 mg by mouth daily.    Yes Historical Provider, MD    Allergies as of 07/23/2015 - Review Complete 07/23/2015  Allergen Reaction Noted  . Sulfa antibiotics Hives 03/29/2015    Family History  Problem Relation Age of Onset  . Diabetes Daughter   . COPD Mother   . Cancer - Colon Father     Social History   Social History  . Marital Status: Married    Spouse Name: N/A  . Number of Children: N/A  . Years of Education: N/A   Occupational History  . Not on file.   Social History Main Topics  . Smoking status: Never Smoker   .  Smokeless tobacco: Not on file  . Alcohol Use: 0.6 oz/week    1 Glasses of wine, 0 Standard drinks or equivalent per week  . Drug Use: No  . Sexual Activity: Not on file   Other Topics Concern  . Not on file   Social History Narrative    Review of Systems: See HPI, otherwise negative ROS  Physical Exam: BP 148/100 mmHg  Pulse 84  Temp(Src) 97.6 F (36.4 C) (Temporal)  Resp 16  Ht 5\' 3"  (1.6 m)  Wt 137 lb (62.143 kg)  BMI 24.27 kg/m2  SpO2 97% General:   Alert,  pleasant and cooperative in NAD Head:  Normocephalic and atraumatic. Neck:  Supple; no masses or thyromegaly. Lungs:  Clear throughout to auscultation.    Heart:  Regular rate and rhythm. Abdomen:  Soft, nontender and nondistended. Normal bowel sounds, without guarding, and without rebound.   Neurologic:  Alert and  oriented x4;  grossly normal neurologically.  Impression/Plan: Melissa Trujillo is here for an endoscopy and colonoscopy to be performed for GERD and Screening  Risks, benefits, limitations, and alternatives regarding  endoscopy and colonoscopy have been reviewed with the patient.  Questions have been answered.  All parties agreeable.   Lucilla Lame, MD  07/31/2015, 7:57 AM

## 2015-07-31 NOTE — Anesthesia Preprocedure Evaluation (Signed)
Anesthesia Evaluation  Patient identified by MRN, date of birth, ID band Patient awake    Reviewed: Allergy & Precautions, NPO status , Patient's Chart, lab work & pertinent test results  Airway Mallampati: I  TM Distance: >3 FB Neck ROM: Full    Dental no notable dental hx.    Pulmonary neg pulmonary ROS,    Pulmonary exam normal        Cardiovascular negative cardio ROS Normal cardiovascular exam     Neuro/Psych negative neurological ROS  negative psych ROS   GI/Hepatic Neg liver ROS, GERD  Medicated and Controlled,  Endo/Other  negative endocrine ROS  Renal/GU negative Renal ROS     Musculoskeletal negative musculoskeletal ROS (+)   Abdominal   Peds  Hematology negative hematology ROS (+)   Anesthesia Other Findings   Reproductive/Obstetrics                             Anesthesia Physical Anesthesia Plan  ASA: II  Anesthesia Plan: MAC   Post-op Pain Management:    Induction: Intravenous  Airway Management Planned:   Additional Equipment:   Intra-op Plan:   Post-operative Plan:   Informed Consent: I have reviewed the patients History and Physical, chart, labs and discussed the procedure including the risks, benefits and alternatives for the proposed anesthesia with the patient or authorized representative who has indicated his/her understanding and acceptance.     Plan Discussed with: CRNA  Anesthesia Plan Comments:         Anesthesia Quick Evaluation

## 2015-08-01 ENCOUNTER — Encounter: Payer: Self-pay | Admitting: Gastroenterology

## 2015-08-04 NOTE — Discharge Summary (Signed)
[facility] - [place] at [facility]   PATIENT NAME: Melissa Trujillo    MR#:  [id number]  DATE OF BIRTH:  [birth year]  DATE OF ADMISSION:  07/21/2015 ADMITTING PHYSICIAN: Wyatt Haste, MD  DATE OF DISCHARGE: 07/22/2015  2:20 PM  PRIMARY CARE PHYSICIAN: Schuyler Amor, MD    ADMISSION DIAGNOSIS:  Chest pain, unspecified chest pain type [R07.9]  DISCHARGE DIAGNOSIS:  Active Problems:   Chest pain, central   SECONDARY DIAGNOSIS:   Past Medical History:  Diagnosis Date  . GERD (gastroesophageal reflux disease)   . Squamous cell carcinoma ([facility])   . Urinary tract infection     HOSPITAL COURSE:   * For chest pain- she was monitored on telemetry and serial troponin was followed which remained stable and EKG was normal. Her pain resolved. She was seen by a cardiologist who suggested no further cardiac workup and advised to do GI follow-up for possible endoscopy. She was discharged home and advise to follow-up in GI clinic.  DISCHARGE CONDITIONS:   Stable  CONSULTS OBTAINED:  Treatment Team:  Wyatt Haste, MD  DRUG ALLERGIES:   Allergies  Allergen Reactions  . Sulfa Antibiotics Hives  . Latex Rash    If tape is left on for a long time.    DISCHARGE MEDICATIONS:   Discharge Medication List as of 07/22/2015  2:05 PM    START taking these medications   Details  pantoprazole (PROTONIX) 40 MG tablet Take 1 tablet (40 mg total) by mouth 2 (two) times daily., Starting 07/22/2015, Until Discontinued, Normal      CONTINUE these medications which have NOT CHANGED   Details  calcium carbonate (TUMS - DOSED IN MG ELEMENTAL CALCIUM) 500 MG chewable tablet Chew 1 tablet by mouth as needed for indigestion or heartburn., Until Discontinued, Historical Med    lactobacillus acidophilus (BACID) TABS tablet Take 1 tablet by mouth daily. , Until Discontinued, Historical Med    Multiple Vitamin (MULTIVITAMIN) tablet Take 1 tablet by mouth daily., Until  Discontinued, Historical Med    Omega-3 Fatty Acids (FISH OIL) 1000 MG CAPS Take 1,000 mg by mouth daily. , Until Discontinued, Historical Med         DISCHARGE INSTRUCTIONS:    Follow with GI clinic.  If you experience worsening of your admission symptoms, develop shortness of breath, life threatening emergency, suicidal or homicidal thoughts you must seek medical attention immediately by calling 911 or calling your MD immediately  if symptoms less severe.  You Must read complete instructions/literature along with all the possible adverse reactions/side effects for all the Medicines you take and that have been prescribed to you. Take any new Medicines after you have completely understood and accept all the possible adverse reactions/side effects.   Please note  You were cared for by a hospitalist during your hospital stay. If you have any questions about your discharge medications or the care you received while you were in the hospital after you are discharged, you can call the unit and asked to speak with the hospitalist on call if the hospitalist that took care of you is not available. Once you are discharged, your primary care physician will handle any further medical issues. Please note that NO REFILLS for any discharge medications will be authorized once you are discharged, as it is imperative that you return to your primary care physician (or establish a relationship with a primary care physician if you do not have one) for your aftercare needs so that  they can reassess your need for medications and monitor your lab values.    Today   CHIEF COMPLAINT:   Chief Complaint  Patient presents with  . Chest Pain    HISTORY OF PRESENT ILLNESS:  Melissa Trujillo  is a 70 y.o. female with a known history of GERD well controlled presenting with chest pain. She describes acute onset chest pain which awoke her around 7 in the morning retrosternal in location pressure in quality radiation to  the neck intensity 8/10. She states she tried to take some Tums and belch without any relief no worsening or relieving factors. Currently chest pain-free. She had associated shortness of breath and diaphoresis during the episode.   VITAL SIGNS:  Blood pressure 131/60, pulse 66, temperature 98 F (36.7 C), temperature source Oral, resp. rate 18, height 5\' 3"  (1.6 m), weight 64 kg (141 lb 1.6 oz), SpO2 94 %.  I/O:  No intake or output data in the 24 hours ending 08/04/15 0928  PHYSICAL EXAMINATION:  GENERAL:  70 y.o.-year-old patient lying in the bed with no acute distress.  EYES: Pupils equal, round, reactive to light and accommodation. No scleral icterus. Extraocular muscles intact.  HEENT: Head atraumatic, normocephalic. Oropharynx and nasopharynx clear.  NECK:  Supple, no jugular venous distention. No thyroid enlargement, no tenderness.  LUNGS: Normal breath sounds bilaterally, no wheezing, rales,rhonchi or crepitation. No use of accessory muscles of respiration.  CARDIOVASCULAR: S1, S2 normal. No murmurs, rubs, or gallops.  ABDOMEN: Soft, non-tender, non-distended. Bowel sounds present. No organomegaly or mass.  EXTREMITIES: No pedal edema, cyanosis, or clubbing.  NEUROLOGIC: Cranial nerves II through XII are intact. Muscle strength 5/5 in all extremities. Sensation intact. Gait not checked.  PSYCHIATRIC: The patient is alert and oriented x 3.  SKIN: No obvious rash, lesion, or ulcer.   DATA REVIEW:   CBC No results for input(s): WBC, HGB, HCT, PLT in the last 168 hours.  Chemistries  No results for input(s): NA, K, CL, CO2, GLUCOSE, BUN, CREATININE, CALCIUM, MG, AST, ALT, ALKPHOS, BILITOT in the last 168 hours.  Invalid input(s): GFRCGP  Cardiac Enzymes No results for input(s): TROPONINI in the last 168 hours.  Microbiology Results  Results for orders placed or performed during the hospital encounter of 03/29/15  Urine culture     Status: None   Collection Time: 03/29/15  11:00 AM  Result Value Ref Range Status   Specimen Description URINE, CLEAN CATCH  Final   Special Requests Normal  Final   Culture NO GROWTH 1 DAY  Final   Report Status 03/31/2015 FINAL  Final    RADIOLOGY:  No results found.  EKG:   Orders placed or performed during the hospital encounter of 07/21/15  . EKG 12-Lead  . EKG 12-Lead  . ED EKG within 10 minutes  . ED EKG within 10 minutes  . EKG      Management plans discussed with the patient, family and they are in agreement.  CODE STATUS:  Code Status History    Date Active Date Inactive Code Status Order ID Comments User Context   07/21/2015 11:26 AM 07/22/2015  5:21 PM Full Code [id number]  Lytle Butte, MD ED      TOTAL TIME TAKING CARE OF THIS PATIENT: 35 minutes.    Vaughan Basta M.D on 08/04/2015 at 9:28 AM  Between 7am to 6pm - Pager - [phone]  After 6pm go to [email or web address] - Proofreader  [employer]  [phone]  CC: Primary care physician; [name], MD   Note: This dictation was prepared with Dragon dictation along with smaller phrase technology. Any transcriptional errors that result from this process are unintentional.

## 2015-08-05 ENCOUNTER — Encounter: Payer: Self-pay | Admitting: Gastroenterology

## 2015-08-06 ENCOUNTER — Encounter: Payer: Self-pay | Admitting: Gastroenterology

## 2015-08-27 ENCOUNTER — Telehealth: Payer: Self-pay | Admitting: Gastroenterology

## 2015-08-27 ENCOUNTER — Other Ambulatory Visit: Payer: Self-pay

## 2015-08-27 DIAGNOSIS — K2100 Gastro-esophageal reflux disease with esophagitis, without bleeding: Secondary | ICD-10-CM

## 2015-08-27 MED ORDER — DEXLANSOPRAZOLE 60 MG PO CPDR: 60.0000 mg | DELAYED_RELEASE_CAPSULE | ORAL | 11 refills | Status: AC

## 2015-08-27 NOTE — Telephone Encounter (Signed)
Rx faxed to [facility], [place] per pt request.

## 2015-08-27 NOTE — Telephone Encounter (Signed)
Patient called and said Dexilant was working and would like to get a RX for it. The samples were 60 mg and she uses [facility] in [place]

## 2017-02-11 IMAGING — DX DG CHEST 1V PORT
1 series · 1 of 1 positions shown · non-contrast
Comparison: None.

CLINICAL DATA: Chest pain for 1 day

EXAM:
PORTABLE CHEST 1 VIEW

[chest ap]
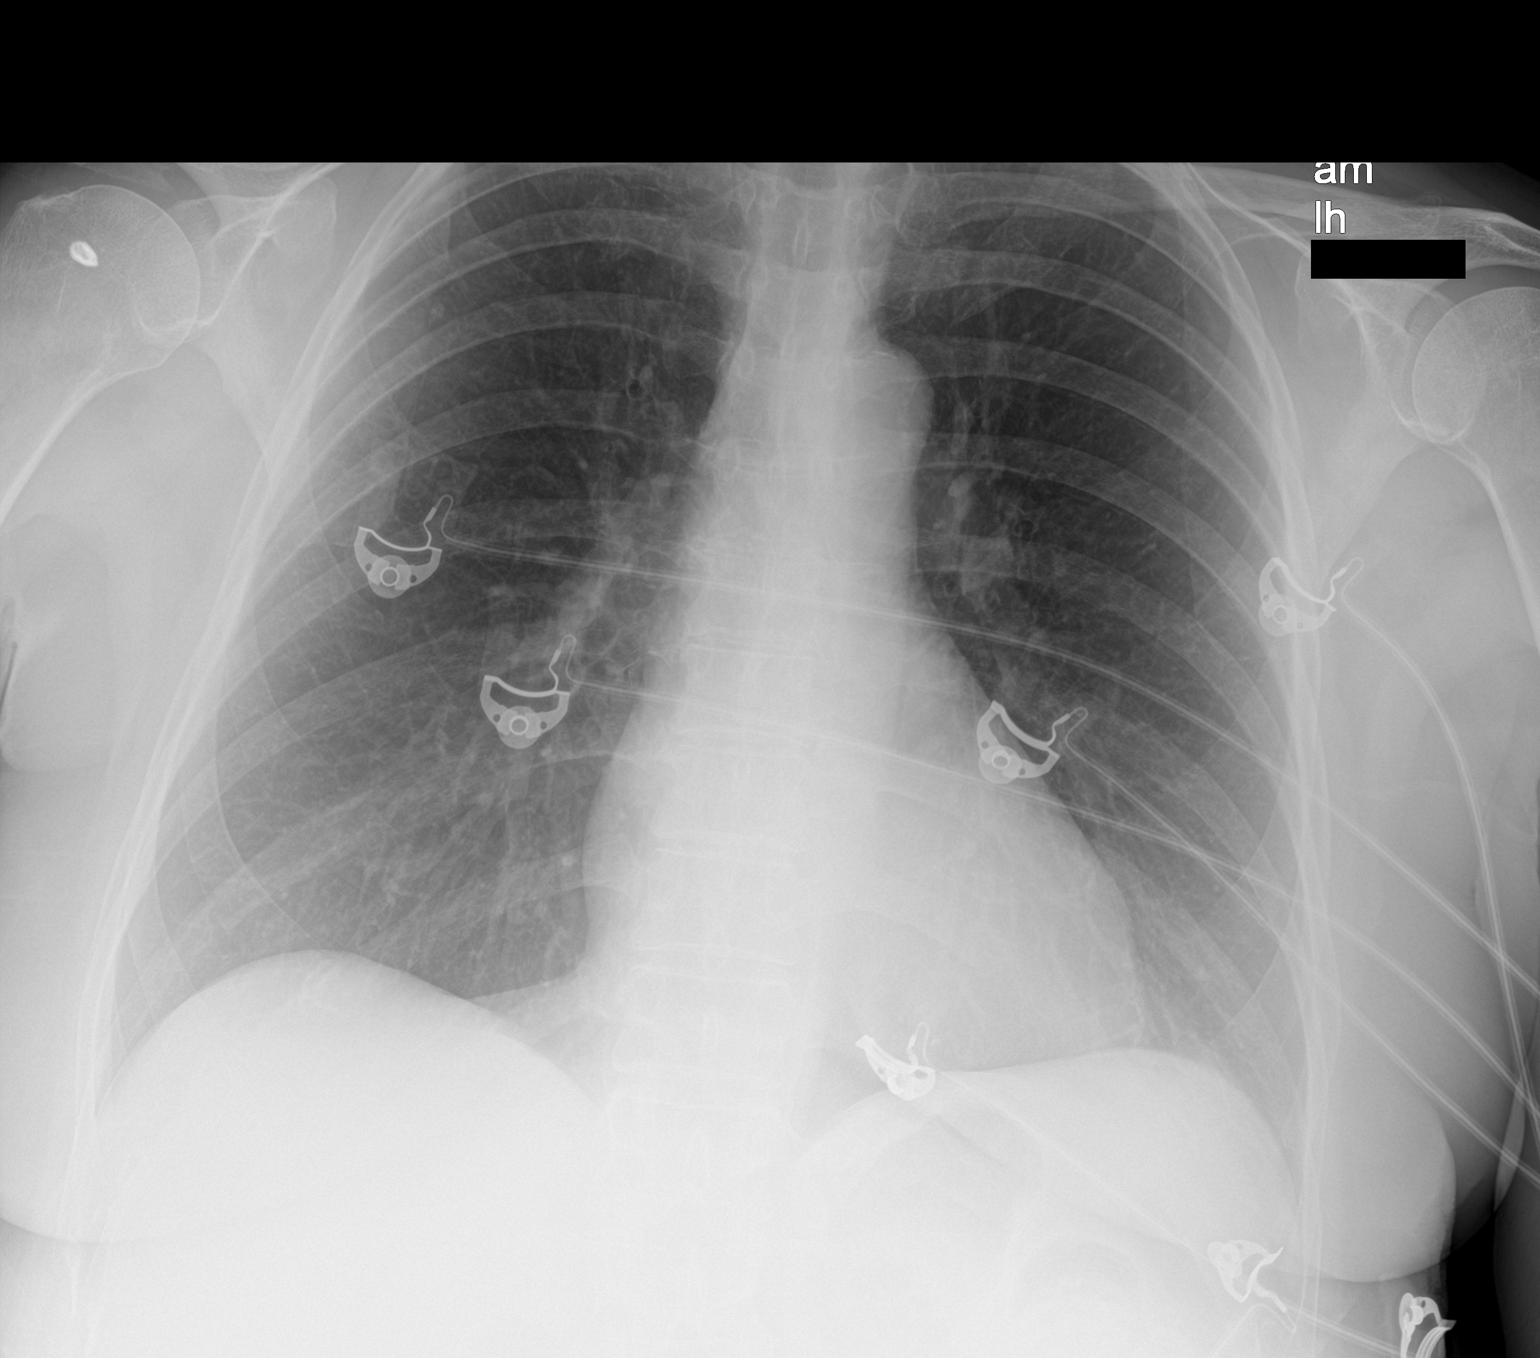

[1 of 1 positions shown; findings below may reference images not displayed]

FINDINGS: There is no edema or consolidation. The heart size and pulmonary
vascularity are normal. No adenopathy. There is calcification in the
aortic arch region. There is postoperative change in the right
shoulder.
IMPRESSION: Aortic atherosclerosis.  No edema or consolidation.
